# Patient Record
Sex: Female | Born: 1970 | Race: White | Hispanic: No | Marital: Married | Smoking: Former smoker
Health system: Southern US, Community
[De-identification: ages and names within clinical notes are randomized; demographics above are authoritative.]

## PROBLEM LIST (undated history)

## (undated) DIAGNOSIS — F329 Major depressive disorder, single episode, unspecified: Secondary | ICD-10-CM

## (undated) DIAGNOSIS — D369 Benign neoplasm, unspecified site: Secondary | ICD-10-CM

## (undated) DIAGNOSIS — F32A Depression, unspecified: Secondary | ICD-10-CM

## (undated) DIAGNOSIS — R51 Headache: Secondary | ICD-10-CM

## (undated) DIAGNOSIS — F419 Anxiety disorder, unspecified: Secondary | ICD-10-CM

## (undated) DIAGNOSIS — D649 Anemia, unspecified: Secondary | ICD-10-CM

## (undated) DIAGNOSIS — R519 Headache, unspecified: Secondary | ICD-10-CM

## (undated) HISTORY — DX: Major depressive disorder, single episode, unspecified: F32.9

## (undated) HISTORY — DX: Depression, unspecified: F32.A

## (undated) HISTORY — DX: Benign neoplasm, unspecified site: D36.9

---

## 1998-10-04 ENCOUNTER — Other Ambulatory Visit: Admission: RE | Admit: 1998-10-04 | Discharge: 1998-10-04 | Payer: Self-pay | Admitting: Gynecology

## 2000-01-26 ENCOUNTER — Other Ambulatory Visit: Admission: RE | Admit: 2000-01-26 | Discharge: 2000-01-26 | Payer: Self-pay | Admitting: Gynecology

## 2002-07-24 ENCOUNTER — Other Ambulatory Visit: Admission: RE | Admit: 2002-07-24 | Discharge: 2002-07-24 | Payer: Self-pay | Admitting: Gynecology

## 2003-10-19 ENCOUNTER — Other Ambulatory Visit: Admission: RE | Admit: 2003-10-19 | Discharge: 2003-10-19 | Payer: Self-pay | Admitting: Gynecology

## 2005-05-18 ENCOUNTER — Other Ambulatory Visit: Admission: RE | Admit: 2005-05-18 | Discharge: 2005-05-18 | Payer: Self-pay | Admitting: Gynecology

## 2005-07-13 HISTORY — PX: MASS EXCISION: SHX2000

## 2005-07-17 ENCOUNTER — Ambulatory Visit (HOSPITAL_BASED_OUTPATIENT_CLINIC_OR_DEPARTMENT_OTHER): Admission: RE | Admit: 2005-07-17 | Discharge: 2005-07-17 | Payer: Self-pay | Admitting: Gynecology

## 2005-07-17 ENCOUNTER — Encounter (INDEPENDENT_AMBULATORY_CARE_PROVIDER_SITE_OTHER): Payer: Self-pay | Admitting: *Deleted

## 2006-08-02 ENCOUNTER — Other Ambulatory Visit: Admission: RE | Admit: 2006-08-02 | Discharge: 2006-08-02 | Payer: Self-pay | Admitting: Gynecology

## 2008-01-10 ENCOUNTER — Other Ambulatory Visit: Admission: RE | Admit: 2008-01-10 | Discharge: 2008-01-10 | Payer: Self-pay | Admitting: Gynecology

## 2009-05-21 ENCOUNTER — Encounter: Admission: RE | Admit: 2009-05-21 | Discharge: 2009-07-09 | Payer: Self-pay | Admitting: Neurology

## 2009-08-13 ENCOUNTER — Other Ambulatory Visit: Admission: RE | Admit: 2009-08-13 | Discharge: 2009-08-13 | Payer: Self-pay | Admitting: Gynecology

## 2009-08-13 ENCOUNTER — Ambulatory Visit: Payer: Self-pay | Admitting: Gynecology

## 2009-08-19 ENCOUNTER — Ambulatory Visit: Payer: Self-pay | Admitting: Gynecology

## 2010-11-28 NOTE — H&P (Signed)
NAMEHAYLIN, Gail Jackson              ACCOUNT NO.:  [id number]   MEDICAL RECORD NO.:  [id number]          PATIENT TYPE:  AMB   LOCATION:  [facility]                         FACILITY:  [facility]   PHYSICIAN:  Timothy P. Fontaine, M.D.DATE OF BIRTH:  1970/08/09   DATE OF ADMISSION:  DATE OF DISCHARGE:                                HISTORY & PHYSICAL   CHIEF COMPLAINT:  Vulvar mass.   HISTORY OF PRESENT ILLNESS:  A 40 year old G1, P75 female, vasectomy birth  control, who presented for an annual exam, at which time a posterior  fourchette vulvar mass was found.  The patient notes that it historically  has been present for several months.  She thought it was a hemorrhoid.  She  is admitted at this time for an excision of this mass.  It is not causing  her any discomfort.  It is lipomatous in feel and overall is felt to be  benign.   PAST MEDICAL HISTORY:  Anxiety/depression.   PAST SURGICAL HISTORY:  None.   CURRENT MEDICATIONS:  Fluoxetine daily.   ALLERGIES:  SULFA.   REVIEW OF SYSTEMS:  Noncontributory.   FAMILY HISTORY:  Noncontributory.   SOCIAL HISTORY:  Noncontributory.   ADMISSION PHYSICAL EXAMINATION:  VITAL SIGNS:  Afebrile.  Vital signs are  stable.  HEENT:  Normal.  LUNGS:  Clear.  CARDIAC:  Regular rate without murmurs, rubs or gallops.  ABDOMEN:  Benign.  PELVIC:  External with lipomatous-feeling 3 cm mass, posterior fourchette.  Feels submucosal.  Does not appear to communicate rectally.  Mobile.  Nontender.  Vagina otherwise normal.  Cervix grossly normal.  Uterus normal  sized.  __________.  Adnexa without masses or tenderness.  Rectovaginal is  normal.   ASSESSMENT:  Lipomatous-type posterior fourchette vulvar mass, present for  several months for excision.  I reviewed with the patient the expected  intraoperative postoperative course, risks, benefits, indications, and  alternatives.  I reviewed what is involved with the procedure, and the risks  of bleeding,  infection, wound complications, necessitating opening and  draining of incisions, prolonged antibiotics, persistent pain and  dyspareunia following the procedure.  The patient understands and accepts  the risks.  She understands that the specimen will be sent to pathology for  analysis.  Questions were answered, and she is ready to continue with  surgery.      Timothy P. Fontaine, M.D.  Electronically Signed     TPF/MEDQ  D:  07/14/2005  T:  07/14/2005  Job:  [id number]

## 2010-11-28 NOTE — Op Note (Signed)
Gail Jackson, Gail Jackson              ACCOUNT NO.:  [id number]   MEDICAL RECORD NO.:  [id number]          PATIENT TYPE:  AMB   LOCATION:  [facility]                         FACILITY:  [facility]   PHYSICIAN:  Timothy P. Fontaine, M.D.DATE OF BIRTH:  February 23, 1971   DATE OF PROCEDURE:  07/17/2005  DATE OF DISCHARGE:                                 OPERATIVE REPORT   PREOPERATIVE DIAGNOSES:  Vulvar mass.   POSTOPERATIVE DIAGNOSES:  Vulvar mass.   PROCEDURE:  Excision of vulvar mass.   SURGEON:  Timothy P. Fontaine, M.D.   ANESTHETIC:  IV sedation, local 1% lidocaine.   ESTIMATED BLOOD LOSS:  Minimal.   SPECIMEN:  Vulvar mass.   COMPLICATIONS:  None.   FINDINGS:  A 2.5 cm well circumscribed fleshy marble-size mass subcutaneous  at the posterior fourchette. No overlying skin changes;  no palpable groin  adenopathy.  Vulva, otherwise grossly normal, bimanual without masses or  abnormalities.   DESCRIPTION OF PROCEDURE:  The patient was taken to the operating room;  underwent IV sedation; and was placed in the dorsal lithotomy position,  received a perineal superficial vaginal preparation with Betadine solution.  The patient was draped in the usual fashion; and subsequently the skin  surrounding the mass at the posterior fourchette were submucosally injected  using 1% lidocaine. Approximately a 6 mL total was used.   Using an elliptical incision the mass was then excised in its entirety to  include the overlying skin. Electrocautery was used to achieve subcutaneous  hemostasis; and the skin was subsequent reapproximated using 4-0 Vicryl in a  running subcuticular stitch. The area was recleansed with Betadine at the  end of the procedure.  The patient placed in a supine position; and taken to  recovery room in good condition, having tolerated the procedure well.      Timothy P. Fontaine, M.D.  Electronically Signed     TPF/MEDQ  D:  07/17/2005  T:  07/17/2005  Job:  [id number]

## 2011-06-25 ENCOUNTER — Encounter: Payer: Self-pay | Admitting: Anesthesiology

## 2011-07-03 ENCOUNTER — Ambulatory Visit (INDEPENDENT_AMBULATORY_CARE_PROVIDER_SITE_OTHER): Payer: BC Managed Care – PPO | Admitting: Gynecology

## 2011-07-03 ENCOUNTER — Encounter: Payer: Self-pay | Admitting: Gynecology

## 2011-07-03 VITALS — BP 138/82 | Ht 64.0 in | Wt 183.0 lb

## 2011-07-03 DIAGNOSIS — Z13220 Encounter for screening for lipoid disorders: Secondary | ICD-10-CM

## 2011-07-03 DIAGNOSIS — Z131 Encounter for screening for diabetes mellitus: Secondary | ICD-10-CM

## 2011-07-03 DIAGNOSIS — Z01419 Encounter for gynecological examination (general) (routine) without abnormal findings: Secondary | ICD-10-CM

## 2011-07-03 DIAGNOSIS — N949 Unspecified condition associated with female genital organs and menstrual cycle: Secondary | ICD-10-CM

## 2011-07-03 DIAGNOSIS — R102 Pelvic and perineal pain: Secondary | ICD-10-CM

## 2011-07-03 NOTE — Patient Instructions (Signed)
Recheck blood pressure as discussed. Remains elevated follow up with her primary physician. Follow up for ultrasound.

## 2011-07-03 NOTE — Progress Notes (Signed)
Addended byCammie Mcgee T on: 07/03/2011 05:55 PM   Modules accepted: Orders

## 2011-07-03 NOTE — Progress Notes (Signed)
Addended byCammie Mcgee T on: 07/03/2011 04:19 PM   Modules accepted: Orders

## 2011-07-03 NOTE — Progress Notes (Signed)
Gail Jackson 1970/08/16 [id number]        40 y.o.  for annual exam.  Notes the right lower quadrant pain that she's been having seems to continue. It occurs every several days last minutes to 20 minutes cramping in nature. She had an ultrasound done January 2011 which was negative with the exception of a small probable physiologic cyst on the left ovary.  Past medical history,surgical history, medications, allergies, family history and social history were all reviewed and documented in the EPIC chart. ROS:  Was performed and pertinent positives and negatives are included in the history.  Exam: chaperone present Filed Vitals:   07/03/11 0907  BP: 138/82   General appearance  Normal Skin grossly normal Head/Neck normal with no cervical or supraclavicular adenopathy thyroid normal Lungs  clear Cardiac RR, without RMG Abdominal  soft, nontender, without masses, organomegaly or hernia Breasts  examined lying and sitting without masses, retractions, discharge or axillary adenopathy. Pelvic  Ext/BUS/vagina  normal   Cervix  normal    Uterus  anteverted, normal size, shape and contour, midline and mobile nontender   Adnexa  Without masses or tenderness    Anus and perineum  normal   Rectovaginal  normal sphincter tone without palpated masses or tenderness.    Assessment/Plan:  40 y.o. female for annual exam.    1. Right lower quadrant discomfort. Exam is normal ultrasound 2 years ago was normal. Recommend repeat ultrasound now. Options to include laparoscopy rule out adhesions or endometriosis discussed. GI possibility or GU was also reviewed. Although she does not have overt diarrhea constipation or urinary symptoms.Patient will follow for ultrasound and we'll go from there. 2. Breast health. Recommended baseline mammogram now. She does have some breast cancer history in her family I recommended she go ahead and get her baseline now and patient agrees to schedule. SBE monthly  reviewed. 3. Pap smear. She has no history of abnormal Pap smears in the past with normal annual reports in her chart, the last being 2011. No Pap smear was done today. I reviewed current screening guidelines we'll plan on every three-year screening she is comfortable with this. 4. Health maintenance. Mild elevated blood pressure noted to the patient. She has had this in the past and feels it is stress related. She actively sees her primary, in fact recently had blood work done through their office and no blood work was done today.  I have asked her just to follow up and make sure she has her blood pressure rechecked in a non-exam situation. If it does remain elevated she will follow up with her primary.    Dara Lords MD, 9:39 AM 07/03/2011

## 2011-07-20 ENCOUNTER — Ambulatory Visit (INDEPENDENT_AMBULATORY_CARE_PROVIDER_SITE_OTHER): Payer: BC Managed Care – PPO | Admitting: Gynecology

## 2011-07-20 ENCOUNTER — Encounter: Payer: Self-pay | Admitting: Gynecology

## 2011-07-20 ENCOUNTER — Ambulatory Visit (INDEPENDENT_AMBULATORY_CARE_PROVIDER_SITE_OTHER): Payer: BC Managed Care – PPO

## 2011-07-20 DIAGNOSIS — N831 Corpus luteum cyst of ovary, unspecified side: Secondary | ICD-10-CM

## 2011-07-20 DIAGNOSIS — R102 Pelvic and perineal pain: Secondary | ICD-10-CM

## 2011-07-20 DIAGNOSIS — N800 Endometriosis of uterus: Secondary | ICD-10-CM

## 2011-07-20 DIAGNOSIS — N949 Unspecified condition associated with female genital organs and menstrual cycle: Secondary | ICD-10-CM

## 2011-07-20 DIAGNOSIS — N83209 Unspecified ovarian cyst, unspecified side: Secondary | ICD-10-CM

## 2011-07-20 NOTE — Progress Notes (Signed)
Patient presents for ultrasound due to her pelvic/abdominal pain. Ultrasound shows endometrial echo 2.5 mm noting she is in her luteal phase. She has a cyst on the right ovary consistent with a corpus luteum measuring 20 mm. Left ovary is normal. She does have some post void residual. On questioning she has no urinary symptoms.  I reviewed findings of her ultrasound with her. Options for laparoscopy rule out endometriosis or adhesive disease discussed. She does not feel that her pain warrants this and would prefer just to monitor her pain at present. If it does persist or certainly worsen she will call me and we will start with a GI evaluation. Ultimately possible laparoscopy. Patient is comfortable with and agrees with the plan.

## 2011-07-20 NOTE — Patient Instructions (Signed)
Keep pain calender. As long as less frequent and acceptable then we will watch. If pain persists or certainly worsens then call me and we will arrange a GI evaluation.

## 2011-07-24 ENCOUNTER — Other Ambulatory Visit: Payer: BC Managed Care – PPO

## 2011-07-24 ENCOUNTER — Ambulatory Visit: Payer: BC Managed Care – PPO | Admitting: Gynecology

## 2013-04-05 ENCOUNTER — Other Ambulatory Visit: Payer: Self-pay | Admitting: Gynecology

## 2013-04-05 DIAGNOSIS — Z1231 Encounter for screening mammogram for malignant neoplasm of breast: Secondary | ICD-10-CM

## 2013-04-06 ENCOUNTER — Ambulatory Visit (HOSPITAL_COMMUNITY): Payer: BC Managed Care – PPO

## 2013-04-10 ENCOUNTER — Ambulatory Visit (HOSPITAL_COMMUNITY): Payer: BC Managed Care – PPO

## 2013-04-10 ENCOUNTER — Ambulatory Visit (HOSPITAL_BASED_OUTPATIENT_CLINIC_OR_DEPARTMENT_OTHER)
Admission: RE | Admit: 2013-04-10 | Discharge: 2013-04-10 | Disposition: A | Discharge status: Home / Self Care | Payer: Commercial Managed Care - PPO | Source: Ambulatory Visit | Attending: Gynecology | Admitting: Gynecology

## 2013-04-10 DIAGNOSIS — Z1231 Encounter for screening mammogram for malignant neoplasm of breast: Secondary | ICD-10-CM | POA: Insufficient documentation

## 2014-05-14 ENCOUNTER — Encounter: Payer: Self-pay | Admitting: Gynecology

## 2014-06-29 ENCOUNTER — Other Ambulatory Visit: Payer: Self-pay | Admitting: Physical Medicine and Rehabilitation

## 2014-06-29 DIAGNOSIS — M5416 Radiculopathy, lumbar region: Secondary | ICD-10-CM

## 2014-07-02 ENCOUNTER — Ambulatory Visit
Admission: RE | Admit: 2014-07-02 | Discharge: 2014-07-02 | Disposition: A | Discharge status: Home / Self Care | Payer: Commercial Managed Care - PPO | Source: Ambulatory Visit | Attending: Physical Medicine and Rehabilitation | Admitting: Physical Medicine and Rehabilitation

## 2014-07-02 DIAGNOSIS — M5416 Radiculopathy, lumbar region: Secondary | ICD-10-CM

## 2014-07-02 MED ORDER — METHYLPREDNISOLONE ACETATE 40 MG/ML INJ SUSP (RADIOLOG
120.0000 mg | Freq: Once | INTRAMUSCULAR | Status: AC
  Administered 2014-07-02: 120 mg via EPIDURAL

## 2014-07-02 MED ORDER — IOHEXOL 180 MG/ML  SOLN
1.0000 mL | Freq: Once | INTRAMUSCULAR | Status: AC | PRN
  Administered 2014-07-02: 1 mL via EPIDURAL

## 2014-07-02 NOTE — Discharge Instructions (Signed)

## 2014-07-26 ENCOUNTER — Ambulatory Visit: Payer: Self-pay | Admitting: Orthopedic Surgery

## 2014-07-27 ENCOUNTER — Ambulatory Visit: Payer: Self-pay | Admitting: Orthopedic Surgery

## 2014-07-27 NOTE — H&P (Signed)
Gail Jackson is an 44 y.o. female.   Chief Complaint: back and left leg pain HPI: The patient is a 44 year old female who presents today for follow up of their back. The patient is being followed for their low back symptoms. They are now 4 week(s) out from when symptoms began. Symptoms reported today include: pain (better), numbness (left calf) and foot pain ("electrical shocks" in the left ankle). Current treatment includes: activity modification, NSAIDs and pain medications. The following medication has been used for pain control: antiinflammatory medication (ibuprofen as needed) and Tylenol (as needed).  Gail Jackson follows up. Continued pain and weakness. She feels like her pain is better, as she has not been doing any type of activities. She tried to ambulate over the weekend and had some severe pain down into her ankle.  Past Medical History  Diagnosis Date  . Angiofibroma     CELLULAR, VULVAR ANGIOFIBROMA , BENIGN  . Depression     Past Surgical History  Procedure Laterality Date  . Mass excision  07/2005    VULVA    Family History  Problem Relation Age of Onset  . Hypertension Mother   . Cancer Mother     BLADDER  . Hypertension Father   . Cancer Father     COLON  . Heart disease Father   . Breast cancer Paternal Aunt   . Diabetes Paternal Grandmother   . Breast cancer Paternal Aunt   . Breast cancer Paternal Aunt    Social History:  reports that she quit smoking about 12 years ago. She has never used smokeless tobacco. She reports that she drinks alcohol. She reports that she does not use illicit drugs.  Allergies:  Allergies  Allergen Reactions  . Sulfa Antibiotics Rash     (Not in a hospital admission)  No results found for this or any previous visit (from the past 48 hour(s)). No results found.  Review of Systems  Constitutional: Negative.   HENT: Negative.   Eyes: Negative.   Respiratory: Negative.   Cardiovascular: Negative.    Gastrointestinal: Negative.   Genitourinary: Negative.   Musculoskeletal: Positive for back pain.  Skin: Negative.   Neurological: Positive for sensory change and focal weakness.  Psychiatric/Behavioral: Negative.     There were no vitals taken for this visit. Physical Exam  Constitutional: She is oriented to person, place, and time. She appears well-developed and well-nourished.  HENT:  Head: Normocephalic and atraumatic.  Eyes: Conjunctivae and EOM are normal. Pupils are equal, round, and reactive to light.  Neck: Normal range of motion. Neck supple.  Cardiovascular: Normal rate and regular rhythm.   Respiratory: Effort normal and breath sounds normal.  GI: Soft. Bowel sounds are normal.  Musculoskeletal:  On exam, she's upright, in mild distress. Mood and affect appropriate. She walks with an antalgic gait. SLR produces buttock, thigh and calf pain on the left, negative on the right. She has quad weakness at 5-/5 as she does EHL weakness, 4+/5, as well as tibialis anterior weakness. Altered sensation in the L4 dermatome. Pain with extension.  Lumbar spine exam reveals no evidence of soft tissue swelling, ecchymosis or deformity. The abdomen is soft and nontender. Nontender over the trochanters. No cellulitis or lymphadenopathy.  Good range of motion of the lumbar spine without associated pain. Motor is 5/5 including plantar flexion and hamstrings. Patient is normoreflexic. There is no Babinski or clonus. Sensory exam is intact to light touch. The patient has good distal pulses. No  DVT. No pain and normal range of motion without instability of the hips, knees and ankles.  Neurological: She is alert and oriented to person, place, and time. She has normal reflexes.  Skin: Skin is warm and dry.  Psychiatric: She has a normal mood and affect.    MRI demonstrates an extruded fragment, fairly large, at L4-5 extending cephalad, compressing into the 4 foramen, compressing the 4 and  5 roots.  Three view radiographs demonstrate disc degeneration at 4-5 and 5-1. No instability in flexion and extension.  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  We discussed options. We discussed increasing her level of activity and ambulation to determine whether she has persistent radicular symptoms associated with activity. I do feel that with ongoing neurologic deficit in the presence of a neural compressive lesion, it would be wise to consider decompression.  Risks and benefits of this procedure were discussed with the patient including worsening of symptoms, no changes in symptoms, recurrent disc herniation, scar tissue, epidural fibrosis, damage to neurovascular structures, cerebral spinal fluid leak which would require repair or patching, DVT, PE, anesthetic complications, etc. We discussed the perioperative course, the hospitalization, and the need for postoperative rehabilitation and the time estimate for recovery. We also discussed the possibility of future surgery including repeat decompression, fusion. The patient was provided an illustrated handout which was discussed in detail. Appropriate anatomic models were used as well.  Ms. Templeman asked concerning whether she could have the surgery done in [place]. 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 With the risks associated with spinal decompression, I would not recommend that type of treatment for this type of injury, especially in the presence of neurologic deficit. We will proceed with lumbar decompression scheduling. They will increase her level of activity over the weekend at an [employer] show. If that continues to aggravate her symptoms in terms of weakness and pain, we will continue with our plan to  proceed with an outpatient decompression. If she has resolution of her symptoms including her weakness, then I certainly indicated this could then be converted to observation. This is now near 4 weeks in duration, and she's had home stretches and exercises, Prednisone and Norco. We discussed continued HEP, activity modification with her and home stretching and an attempt at a McKenzie type technique. If she improves dramatically, then we can consider conservative observation. In the presence of a neural compressive lesion, with a neurologic deficit, I recommend decompression.  Plan microlumbar decompression L4-5 left, possible L3-4  Skilynn Durney M. PA-C for Dr. Tonita Cong 07/27/2014, 12:58 PM

## 2014-08-07 NOTE — Patient Instructions (Addendum)
Gail Jackson  08/07/2014   Your procedure is scheduled on:   Report to [facility] Main  Entrance and follow signs to               [place] at      [phone].  Call this number if you have problems the morning of surgery [phone]   Remember:  Do not eat food or drink liquids :After Midnight.     Take these medicines the morning of surgery with A SIP OF WATER: percocet if needed                                You may not have any metal on your body including hair pins and              piercings  Do not wear jewelry, make-up, lotions, powders or perfumes, deodorant.             Do not wear nail polish.  Do not shave  48 hours prior to surgery.              Men may shave face and neck.   Do not bring valuables to the hospital. [place].  Contacts, dentures or bridgework may not be worn into surgery.  Leave suitcase in the car. After surgery it may be brought to your room.     Patients discharged the day of surgery will not be allowed to drive home.  Name and phone number of your driver:  Special Instructions: N/A              Please read over the following fact sheets you were given: _____________________________________________________________________             [facility] - Preparing for Surgery Before surgery, you can play an important role.  Because skin is not sterile, your skin needs to be as free of germs as possible.  You can reduce the number of germs on your skin by washing with CHG (chlorahexidine gluconate) soap before surgery.  CHG is an antiseptic cleaner which kills germs and bonds with the skin to continue killing germs even after washing. Please DO NOT use if you have an allergy to CHG or antibacterial soaps.  If your skin becomes reddened/irritated stop using the CHG and inform your nurse when you arrive at Short Stay. Do not shave (including legs and underarms) for at least  48 hours prior to the first CHG shower.  You may shave your face/neck. Please follow these instructions carefully:  1.  Shower with CHG Soap the night before surgery and the  morning of Surgery.  2.  If you choose to wash your hair, wash your hair first as usual with your  normal  shampoo.  3.  After you shampoo, rinse your hair and body thoroughly to remove the  shampoo.                           4.  Use CHG as you would any other liquid soap.  You can apply chg directly  to the skin and wash  Gently with a scrungie or clean washcloth.  5.  Apply the CHG Soap to your body ONLY FROM THE NECK DOWN.   Do not use on face/ open                           Wound or open sores. Avoid contact with eyes, ears mouth and genitals (private parts).                       Wash face,  Genitals (private parts) with your normal soap.             6.  Wash thoroughly, paying special attention to the area where your surgery  will be performed.  7.  Thoroughly rinse your body with warm water from the neck down.  8.  DO NOT shower/wash with your normal soap after using and rinsing off  the CHG Soap.                9.  Pat yourself dry with a clean towel.            10.  Wear clean pajamas.            11.  Place clean sheets on your bed the night of your first shower and do not  sleep with pets. Day of Surgery : Do not apply any lotions/deodorants the morning of surgery.  Please wear clean clothes to the hospital/surgery center.  FAILURE TO FOLLOW THESE INSTRUCTIONS MAY RESULT IN THE CANCELLATION OF YOUR SURGERY PATIENT SIGNATURE_________________________________  NURSE SIGNATURE__________________________________  ________________________________________________________________________   Adam Phenix  An incentive spirometer is a tool that can help keep your lungs clear and active. This tool measures how well you are filling your lungs with each breath. Taking long deep breaths may  help reverse or decrease the chance of developing breathing (pulmonary) problems (especially infection) following:  A long period of time when you are unable to move or be active. BEFORE THE PROCEDURE   If the spirometer includes an indicator to show your best effort, your nurse or respiratory therapist will set it to a desired goal.  If possible, sit up straight or lean slightly forward. Try not to slouch.  Hold the incentive spirometer in an upright position. INSTRUCTIONS FOR USE  1. Sit on the edge of your bed if possible, or sit up as far as you can in bed or on a chair. 2. Hold the incentive spirometer in an upright position. 3. Breathe out normally. 4. Place the mouthpiece in your mouth and seal your lips tightly around it. 5. Breathe in slowly and as deeply as possible, raising the piston or the ball toward the top of the column. 6. Hold your breath for 3-5 seconds or for as long as possible. Allow the piston or ball to fall to the bottom of the column. 7. Remove the mouthpiece from your mouth and breathe out normally. 8. Rest for a few seconds and repeat Steps 1 through 7 at least 10 times every 1-2 hours when you are awake. Take your time and take a few normal breaths between deep breaths. 9. The spirometer may include an indicator to show your best effort. Use the indicator as a goal to work toward during each repetition. 10. After each set of 10 deep breaths, practice coughing to be sure your lungs are clear. If you have an incision (the cut made at the time of surgery),  support your incision when coughing by placing a pillow or rolled up towels firmly against it. Once you are able to get out of bed, walk around indoors and cough well. You may stop using the incentive spirometer when instructed by your caregiver.  RISKS AND COMPLICATIONS  Take your time so you do not get dizzy or light-headed.  If you are in pain, you may need to take or ask for pain medication before doing  incentive spirometry. It is harder to take a deep breath if you are having pain. AFTER USE  Rest and breathe slowly and easily.  It can be helpful to keep track of a log of your progress. Your caregiver can provide you with a simple table to help with this. If you are using the spirometer at home, follow these instructions: [place] IF:   You are having difficultly using the spirometer.  You have trouble using the spirometer as often as instructed.  Your pain medication is not giving enough relief while using the spirometer.  You develop fever of 100.5 F (38.1 C) or higher. SEEK IMMEDIATE MEDICAL CARE IF:   You cough up bloody sputum that had not been present before.  You develop fever of 102 F (38.9 C) or greater.  You develop worsening pain at or near the incision site. MAKE SURE YOU:   Understand these instructions.  Will watch your condition.  Will get help right away if you are not doing well or get worse. Document Released: 11/09/2006 Document Revised: 09/21/2011 Document Reviewed: 01/10/2007 [facility] Patient Information 2014 [place], [place].   ________________________________________________________________________

## 2014-08-09 ENCOUNTER — Encounter (HOSPITAL_COMMUNITY): Payer: Self-pay

## 2014-08-09 ENCOUNTER — Encounter (HOSPITAL_COMMUNITY)
Admission: RE | Admit: 2014-08-09 | Discharge: 2014-08-09 | Disposition: A | Discharge status: Home / Self Care | Payer: Commercial Managed Care - PPO | Source: Ambulatory Visit | Attending: Specialist | Admitting: Specialist

## 2014-08-09 DIAGNOSIS — M5126 Other intervertebral disc displacement, lumbar region: Secondary | ICD-10-CM | POA: Insufficient documentation

## 2014-08-09 DIAGNOSIS — M4806 Spinal stenosis, lumbar region: Secondary | ICD-10-CM | POA: Diagnosis not present

## 2014-08-09 DIAGNOSIS — Z01818 Encounter for other preprocedural examination: Secondary | ICD-10-CM | POA: Diagnosis present

## 2014-08-09 HISTORY — DX: Headache: R51

## 2014-08-09 HISTORY — DX: Anxiety disorder, unspecified: F41.9

## 2014-08-09 HISTORY — DX: Headache, unspecified: R51.9

## 2014-08-09 HISTORY — DX: Anemia, unspecified: D64.9

## 2014-08-09 LAB — CBC
HCT: 38.6 % (ref 36.0–46.0)
Hemoglobin: 12.4 g/dL (ref 12.0–15.0)
MCH: 28.6 pg (ref 26.0–34.0)
MCHC: 32.1 g/dL (ref 30.0–36.0)
MCV: 88.9 fL (ref 78.0–100.0)
PLATELETS: 309 10*3/uL (ref 150–400)
RBC: 4.34 MIL/uL (ref 3.87–5.11)
RDW: 13 % (ref 11.5–15.5)
WBC: 6.9 10*3/uL (ref 4.0–10.5)

## 2014-08-09 LAB — BASIC METABOLIC PANEL
Anion gap: 9 (ref 5–15)
BUN: 11 mg/dL (ref 6–23)
CHLORIDE: 101 mmol/L (ref 96–112)
CO2: 28 mmol/L (ref 19–32)
CREATININE: 0.65 mg/dL (ref 0.50–1.10)
Calcium: 9.3 mg/dL (ref 8.4–10.5)
Glucose, Bld: 104 mg/dL — ABNORMAL HIGH (ref 70–99)
Potassium: 3.9 mmol/L (ref 3.5–5.1)
Sodium: 138 mmol/L (ref 135–145)

## 2014-08-09 LAB — SURGICAL PCR SCREEN
MRSA, PCR: NEGATIVE
Staphylococcus aureus: NEGATIVE

## 2014-08-09 LAB — HCG, SERUM, QUALITATIVE: Preg, Serum: NEGATIVE

## 2014-08-15 ENCOUNTER — Ambulatory Visit (HOSPITAL_COMMUNITY): Payer: Commercial Managed Care - PPO

## 2014-08-15 ENCOUNTER — Encounter (HOSPITAL_COMMUNITY)
Admission: RE | Disposition: A | Discharge status: Home / Self Care | Payer: Self-pay | Source: Ambulatory Visit | Attending: Specialist

## 2014-08-15 ENCOUNTER — Ambulatory Visit (HOSPITAL_COMMUNITY): Payer: Commercial Managed Care - PPO | Admitting: Certified Registered Nurse Anesthetist

## 2014-08-15 ENCOUNTER — Ambulatory Visit (HOSPITAL_COMMUNITY)
Admission: RE | Admit: 2014-08-15 | Discharge: 2014-08-16 | Disposition: A | Discharge status: Home / Self Care | Payer: Commercial Managed Care - PPO | Source: Ambulatory Visit | Attending: Specialist | Admitting: Specialist

## 2014-08-15 ENCOUNTER — Encounter (HOSPITAL_COMMUNITY): Payer: Self-pay | Admitting: *Deleted

## 2014-08-15 DIAGNOSIS — D649 Anemia, unspecified: Secondary | ICD-10-CM | POA: Insufficient documentation

## 2014-08-15 DIAGNOSIS — M5126 Other intervertebral disc displacement, lumbar region: Secondary | ICD-10-CM

## 2014-08-15 DIAGNOSIS — Z87891 Personal history of nicotine dependence: Secondary | ICD-10-CM | POA: Diagnosis not present

## 2014-08-15 DIAGNOSIS — R52 Pain, unspecified: Secondary | ICD-10-CM

## 2014-08-15 DIAGNOSIS — F329 Major depressive disorder, single episode, unspecified: Secondary | ICD-10-CM | POA: Diagnosis not present

## 2014-08-15 DIAGNOSIS — Z882 Allergy status to sulfonamides status: Secondary | ICD-10-CM | POA: Insufficient documentation

## 2014-08-15 DIAGNOSIS — F419 Anxiety disorder, unspecified: Secondary | ICD-10-CM | POA: Insufficient documentation

## 2014-08-15 DIAGNOSIS — M4806 Spinal stenosis, lumbar region: Secondary | ICD-10-CM | POA: Insufficient documentation

## 2014-08-15 DIAGNOSIS — M48061 Spinal stenosis, lumbar region without neurogenic claudication: Secondary | ICD-10-CM | POA: Diagnosis present

## 2014-08-15 HISTORY — PX: LUMBAR LAMINECTOMY/DECOMPRESSION MICRODISCECTOMY: SHX5026

## 2014-08-15 SURGERY — LUMBAR LAMINECTOMY/DECOMPRESSION MICRODISCECTOMY 1 LEVEL
Anesthesia: General | Site: Back | Laterality: Left

## 2014-08-15 MED ORDER — MENTHOL 3 MG MT LOZG: 1.0000 | LOZENGE | OROMUCOSAL | Status: DC | PRN

## 2014-08-15 MED ORDER — METHOCARBAMOL 500 MG PO TABS: 500.0000 mg | ORAL_TABLET | Freq: Four times a day (QID) | ORAL | Status: DC | PRN

## 2014-08-15 MED ORDER — BUPIVACAINE-EPINEPHRINE (PF) 0.5% -1:200000 IJ SOLN
INTRAMUSCULAR | Status: AC
  Filled 2014-08-15: qty 30

## 2014-08-15 MED ORDER — HYDROMORPHONE HCL 1 MG/ML IJ SOLN
0.2500 mg | INTRAMUSCULAR | Status: DC | PRN
  Administered 2014-08-15 (×2): 0.5 mg via INTRAVENOUS

## 2014-08-15 MED ORDER — POLYMYXIN B SULFATE 500000 UNITS IJ SOLR
INTRAMUSCULAR | Status: DC | PRN
  Administered 2014-08-15: 500 mL

## 2014-08-15 MED ORDER — SCOPOLAMINE 1 MG/3DAYS TD PT72
MEDICATED_PATCH | TRANSDERMAL | Status: AC
  Filled 2014-08-15: qty 1

## 2014-08-15 MED ORDER — BUPIVACAINE-EPINEPHRINE 0.5% -1:200000 IJ SOLN
INTRAMUSCULAR | Status: DC | PRN
  Administered 2014-08-15: 11 mL

## 2014-08-15 MED ORDER — SODIUM CHLORIDE 0.9 % IJ SOLN
3.0000 mL | INTRAMUSCULAR | Status: DC | PRN
  Administered 2014-08-16: 3 mL via INTRAVENOUS
  Filled 2014-08-15: qty 3

## 2014-08-15 MED ORDER — DEXAMETHASONE SODIUM PHOSPHATE 10 MG/ML IJ SOLN
INTRAMUSCULAR | Status: DC | PRN
  Administered 2014-08-15: 10 mg via INTRAVENOUS

## 2014-08-15 MED ORDER — HYDROMORPHONE HCL 1 MG/ML IJ SOLN
INTRAMUSCULAR | Status: AC
  Filled 2014-08-15: qty 1

## 2014-08-15 MED ORDER — PROMETHAZINE HCL 25 MG/ML IJ SOLN
6.2500 mg | INTRAMUSCULAR | Status: AC | PRN
  Administered 2014-08-15 (×2): 6.25 mg via INTRAVENOUS

## 2014-08-15 MED ORDER — RISAQUAD PO CAPS
1.0000 | ORAL_CAPSULE | Freq: Every day | ORAL | Status: DC
Start: 2014-08-15 — End: 2014-08-16
  Administered 2014-08-15 – 2014-08-16 (×2): 1 via ORAL
  Filled 2014-08-15 (×2): qty 1

## 2014-08-15 MED ORDER — METHOCARBAMOL 500 MG PO TABS: 500.0000 mg | ORAL_TABLET | Freq: Three times a day (TID) | ORAL | Status: DC

## 2014-08-15 MED ORDER — NEOSTIGMINE METHYLSULFATE 10 MG/10ML IV SOLN
INTRAVENOUS | Status: DC | PRN
  Administered 2014-08-15: 3 mg via INTRAVENOUS

## 2014-08-15 MED ORDER — ONDANSETRON HCL 4 MG/2ML IJ SOLN: 4.0000 mg | INTRAMUSCULAR | Status: DC | PRN

## 2014-08-15 MED ORDER — OXYCODONE-ACETAMINOPHEN 5-325 MG PO TABS
1.0000 | ORAL_TABLET | ORAL | Status: DC | PRN
  Administered 2014-08-15 – 2014-08-16 (×2): 2 via ORAL
  Administered 2014-08-16 (×2): 1 via ORAL
  Administered 2014-08-16: 2 via ORAL
  Filled 2014-08-15 (×4): qty 2
  Filled 2014-08-15: qty 1

## 2014-08-15 MED ORDER — PROMETHAZINE HCL 25 MG/ML IJ SOLN: 12.5000 mg | Freq: Four times a day (QID) | INTRAMUSCULAR | Status: AC | PRN

## 2014-08-15 MED ORDER — LACTATED RINGERS IV SOLN
INTRAVENOUS | Status: DC
  Administered 2014-08-15 (×2): via INTRAVENOUS

## 2014-08-15 MED ORDER — CEFAZOLIN SODIUM-DEXTROSE 2-3 GM-% IV SOLR
INTRAVENOUS | Status: AC
Start: 2014-08-15 — End: 2014-08-15
  Filled 2014-08-15: qty 50

## 2014-08-15 MED ORDER — PHENOL 1.4 % MT LIQD: 1.0000 | OROMUCOSAL | Status: DC | PRN

## 2014-08-15 MED ORDER — GLYCOPYRROLATE 0.2 MG/ML IJ SOLN
INTRAMUSCULAR | Status: AC
  Filled 2014-08-15: qty 3

## 2014-08-15 MED ORDER — HYDROMORPHONE HCL 1 MG/ML IJ SOLN: 0.5000 mg | INTRAMUSCULAR | Status: DC | PRN

## 2014-08-15 MED ORDER — ROCURONIUM BROMIDE 100 MG/10ML IV SOLN
INTRAVENOUS | Status: DC | PRN
  Administered 2014-08-15: 30 mg via INTRAVENOUS

## 2014-08-15 MED ORDER — BISACODYL 5 MG PO TBEC: 5.0000 mg | DELAYED_RELEASE_TABLET | Freq: Every day | ORAL | Status: DC | PRN

## 2014-08-15 MED ORDER — SODIUM CHLORIDE 0.9 % IJ SOLN
3.0000 mL | Freq: Two times a day (BID) | INTRAMUSCULAR | Status: DC
  Administered 2014-08-16: 3 mL via INTRAVENOUS

## 2014-08-15 MED ORDER — ONDANSETRON HCL 4 MG/2ML IJ SOLN
INTRAMUSCULAR | Status: DC | PRN
  Administered 2014-08-15: 4 mg via INTRAVENOUS

## 2014-08-15 MED ORDER — ACETAMINOPHEN 325 MG PO TABS: 650.0000 mg | ORAL_TABLET | ORAL | Status: DC | PRN

## 2014-08-15 MED ORDER — HYDROCODONE-ACETAMINOPHEN 5-325 MG PO TABS: 1.0000 | ORAL_TABLET | ORAL | Status: DC | PRN

## 2014-08-15 MED ORDER — SENNOSIDES-DOCUSATE SODIUM 8.6-50 MG PO TABS: 1.0000 | ORAL_TABLET | Freq: Every evening | ORAL | Status: DC | PRN

## 2014-08-15 MED ORDER — ONDANSETRON HCL 4 MG/2ML IJ SOLN
INTRAMUSCULAR | Status: AC
  Filled 2014-08-15: qty 2

## 2014-08-15 MED ORDER — METHOCARBAMOL 1000 MG/10ML IJ SOLN
500.0000 mg | Freq: Four times a day (QID) | INTRAVENOUS | Status: DC | PRN
  Administered 2014-08-15: 500 mg via INTRAVENOUS
  Filled 2014-08-15 (×2): qty 5

## 2014-08-15 MED ORDER — KCL IN DEXTROSE-NACL 20-5-0.45 MEQ/L-%-% IV SOLN
INTRAVENOUS | Status: AC
  Administered 2014-08-15: 13:00:00 via INTRAVENOUS
  Filled 2014-08-15 (×2): qty 1000

## 2014-08-15 MED ORDER — CEFAZOLIN SODIUM-DEXTROSE 2-3 GM-% IV SOLR
2.0000 g | Freq: Three times a day (TID) | INTRAVENOUS | Status: AC
  Administered 2014-08-15 – 2014-08-16 (×3): 2 g via INTRAVENOUS
  Filled 2014-08-15 (×3): qty 50

## 2014-08-15 MED ORDER — CEFAZOLIN SODIUM-DEXTROSE 2-3 GM-% IV SOLR
2.0000 g | INTRAVENOUS | Status: AC
  Administered 2014-08-15: 2 g via INTRAVENOUS

## 2014-08-15 MED ORDER — FENTANYL CITRATE 0.05 MG/ML IJ SOLN
INTRAMUSCULAR | Status: AC
  Filled 2014-08-15: qty 5

## 2014-08-15 MED ORDER — DOCUSATE SODIUM 100 MG PO CAPS
100.0000 mg | ORAL_CAPSULE | Freq: Two times a day (BID) | ORAL | Status: DC
  Administered 2014-08-16: 100 mg via ORAL

## 2014-08-15 MED ORDER — DULOXETINE HCL 60 MG PO CPEP
60.0000 mg | ORAL_CAPSULE | Freq: Every evening | ORAL | Status: DC
  Administered 2014-08-15: 60 mg via ORAL
  Filled 2014-08-15 (×2): qty 1

## 2014-08-15 MED ORDER — THROMBIN 5000 UNITS EX SOLR
CUTANEOUS | Status: DC | PRN
  Administered 2014-08-15: 10000 [IU] via TOPICAL

## 2014-08-15 MED ORDER — FLEET ENEMA 7-19 GM/118ML RE ENEM: 1.0000 | ENEMA | Freq: Once | RECTAL | Status: AC | PRN

## 2014-08-15 MED ORDER — PROPOFOL 10 MG/ML IV BOLUS
INTRAVENOUS | Status: AC
  Filled 2014-08-15: qty 20

## 2014-08-15 MED ORDER — THROMBIN 5000 UNITS EX SOLR
CUTANEOUS | Status: AC
  Filled 2014-08-15: qty 10000

## 2014-08-15 MED ORDER — NEOSTIGMINE METHYLSULFATE 10 MG/10ML IV SOLN
INTRAVENOUS | Status: AC
  Filled 2014-08-15: qty 1

## 2014-08-15 MED ORDER — ADULT MULTIVITAMIN W/MINERALS CH
1.0000 | ORAL_TABLET | Freq: Every day | ORAL | Status: DC
  Administered 2014-08-15 – 2014-08-16 (×2): 1 via ORAL
  Filled 2014-08-15 (×2): qty 1

## 2014-08-15 MED ORDER — LIDOCAINE HCL (CARDIAC) 20 MG/ML IV SOLN
INTRAVENOUS | Status: DC | PRN
  Administered 2014-08-15: 100 mg via INTRAVENOUS

## 2014-08-15 MED ORDER — DOCUSATE SODIUM 100 MG PO CAPS: 100.0000 mg | ORAL_CAPSULE | Freq: Two times a day (BID) | ORAL | Status: DC | PRN

## 2014-08-15 MED ORDER — FENTANYL CITRATE 0.05 MG/ML IJ SOLN
INTRAMUSCULAR | Status: DC | PRN
  Administered 2014-08-15 (×5): 50 ug via INTRAVENOUS

## 2014-08-15 MED ORDER — ALUM & MAG HYDROXIDE-SIMETH 200-200-20 MG/5ML PO SUSP: 30.0000 mL | Freq: Four times a day (QID) | ORAL | Status: DC | PRN

## 2014-08-15 MED ORDER — SCOPOLAMINE 1 MG/3DAYS TD PT72
1.0000 | MEDICATED_PATCH | TRANSDERMAL | Status: DC
  Administered 2014-08-15: 1 via TRANSDERMAL
  Filled 2014-08-15: qty 1

## 2014-08-15 MED ORDER — DEXAMETHASONE SODIUM PHOSPHATE 10 MG/ML IJ SOLN
INTRAMUSCULAR | Status: AC
  Filled 2014-08-15: qty 1

## 2014-08-15 MED ORDER — SODIUM CHLORIDE 0.9 % IR SOLN
Status: AC
  Filled 2014-08-15: qty 1

## 2014-08-15 MED ORDER — ACETAMINOPHEN 650 MG RE SUPP: 650.0000 mg | RECTAL | Status: DC | PRN

## 2014-08-15 MED ORDER — MIDAZOLAM HCL 5 MG/5ML IJ SOLN
INTRAMUSCULAR | Status: DC | PRN
  Administered 2014-08-15: 2 mg via INTRAVENOUS

## 2014-08-15 MED ORDER — SODIUM CHLORIDE 0.9 % IV SOLN: 250.0000 mL | INTRAVENOUS | Status: DC

## 2014-08-15 MED ORDER — PROMETHAZINE HCL 25 MG/ML IJ SOLN
INTRAMUSCULAR | Status: AC
  Filled 2014-08-15: qty 1

## 2014-08-15 MED ORDER — OXYCODONE-ACETAMINOPHEN 5-325 MG PO TABS: 1.0000 | ORAL_TABLET | ORAL | Status: DC | PRN

## 2014-08-15 MED ORDER — GLYCOPYRROLATE 0.2 MG/ML IJ SOLN
INTRAMUSCULAR | Status: DC | PRN
  Administered 2014-08-15: 0.4 mg via INTRAVENOUS

## 2014-08-15 MED ORDER — MIDAZOLAM HCL 2 MG/2ML IJ SOLN
INTRAMUSCULAR | Status: AC
  Filled 2014-08-15: qty 2

## 2014-08-15 MED ORDER — LIDOCAINE HCL (CARDIAC) 20 MG/ML IV SOLN
INTRAVENOUS | Status: AC
  Filled 2014-08-15: qty 5

## 2014-08-15 MED ORDER — SUCCINYLCHOLINE CHLORIDE 20 MG/ML IJ SOLN
INTRAMUSCULAR | Status: DC | PRN
  Administered 2014-08-15: 100 mg via INTRAVENOUS

## 2014-08-15 MED ORDER — PROPOFOL 10 MG/ML IV BOLUS
INTRAVENOUS | Status: DC | PRN
  Administered 2014-08-15: 150 mg via INTRAVENOUS

## 2014-08-15 MED ORDER — ROCURONIUM BROMIDE 100 MG/10ML IV SOLN
INTRAVENOUS | Status: AC
  Filled 2014-08-15: qty 1

## 2014-08-15 SURGICAL SUPPLY — 44 items
BAG ZIPLOCK 12X15 (MISCELLANEOUS) ×3 IMPLANT
CLEANER TIP ELECTROSURG 2X2 (MISCELLANEOUS) ×3 IMPLANT
CLOSURE WOUND 1/2 X4 (GAUZE/BANDAGES/DRESSINGS) ×1
CLOTH 2% CHLOROHEXIDINE 3PK (PERSONAL CARE ITEMS) ×3 IMPLANT
DRAPE MICROSCOPE LEICA (MISCELLANEOUS) ×3 IMPLANT
DRAPE POUCH INSTRU U-SHP 10X18 (DRAPES) ×3 IMPLANT
DRAPE SURG 17X11 SM STRL (DRAPES) ×3 IMPLANT
DRAPE UTILITY XL STRL (DRAPES) ×3 IMPLANT
DRSG AQUACEL AG ADV 3.5X 4 (GAUZE/BANDAGES/DRESSINGS) ×3 IMPLANT
DRSG AQUACEL AG ADV 3.5X 6 (GAUZE/BANDAGES/DRESSINGS) IMPLANT
DURAPREP 26ML APPLICATOR (WOUND CARE) ×3 IMPLANT
DURASEAL SPINE SEALANT 3ML (MISCELLANEOUS) IMPLANT
ELECT BLADE TIP CTD 4 INCH (ELECTRODE) ×3 IMPLANT
ELECT REM PT RETURN 9FT ADLT (ELECTROSURGICAL) ×3
ELECTRODE REM PT RTRN 9FT ADLT (ELECTROSURGICAL) ×1 IMPLANT
GLOVE BIOGEL PI IND STRL 7.5 (GLOVE) ×1 IMPLANT
GLOVE BIOGEL PI INDICATOR 7.5 (GLOVE) ×2
GLOVE SURG SS PI 7.5 STRL IVOR (GLOVE) ×3 IMPLANT
GLOVE SURG SS PI 8.0 STRL IVOR (GLOVE) ×6 IMPLANT
GOWN STRL REUS W/TWL XL LVL3 (GOWN DISPOSABLE) ×6 IMPLANT
IV CATH 14GX2 1/4 (CATHETERS) ×3 IMPLANT
KIT BASIN OR (CUSTOM PROCEDURE TRAY) ×3 IMPLANT
KIT POSITIONING SURG ANDREWS (MISCELLANEOUS) ×3 IMPLANT
MANIFOLD NEPTUNE II (INSTRUMENTS) ×3 IMPLANT
NEEDLE SPNL 18GX3.5 QUINCKE PK (NEEDLE) ×6 IMPLANT
PACK LAMINECTOMY ORTHO (CUSTOM PROCEDURE TRAY) ×3 IMPLANT
PATTIES SURGICAL .5 X.5 (GAUZE/BANDAGES/DRESSINGS) IMPLANT
PATTIES SURGICAL .75X.75 (GAUZE/BANDAGES/DRESSINGS) ×3 IMPLANT
PATTIES SURGICAL 1X1 (DISPOSABLE) IMPLANT
SPONGE SURGIFOAM ABS GEL 100 (HEMOSTASIS) ×3 IMPLANT
STAPLER VISISTAT (STAPLE) IMPLANT
STRIP CLOSURE SKIN 1/2X4 (GAUZE/BANDAGES/DRESSINGS) ×2 IMPLANT
SUT NURALON 4 0 TR CR/8 (SUTURE) IMPLANT
SUT PROLENE 3 0 PS 2 (SUTURE) ×3 IMPLANT
SUT VIC AB 1 CT1 27 (SUTURE) ×4
SUT VIC AB 1 CT1 27XBRD ANTBC (SUTURE) ×2 IMPLANT
SUT VIC AB 1-0 CT2 27 (SUTURE) IMPLANT
SUT VIC AB 2-0 CT1 27 (SUTURE)
SUT VIC AB 2-0 CT1 TAPERPNT 27 (SUTURE) IMPLANT
SUT VIC AB 2-0 CT2 27 (SUTURE) ×3 IMPLANT
SYR 3ML LL SCALE MARK (SYRINGE) ×3 IMPLANT
TOWEL OR 17X26 10 PK STRL BLUE (TOWEL DISPOSABLE) ×3 IMPLANT
TOWEL OR NON WOVEN STRL DISP B (DISPOSABLE) ×3 IMPLANT
YANKAUER SUCT BULB TIP NO VENT (SUCTIONS) IMPLANT

## 2014-08-15 NOTE — Anesthesia Preprocedure Evaluation (Addendum)
Anesthesia Evaluation  Patient identified by MRN, date of birth, ID band Patient awake    Reviewed: Allergy & Precautions, NPO status , Patient's Chart, lab work & pertinent test results  Airway Mallampati: II  TM Distance: >3 FB Neck ROM: Full    Dental no notable dental hx. (+)    Pulmonary former smoker,  breath sounds clear to auscultation  Pulmonary exam normal       Cardiovascular Exercise Tolerance: Good negative cardio ROS  Rhythm:Regular Rate:Normal     Neuro/Psych  Headaches, PSYCHIATRIC DISORDERS Anxiety Depression    GI/Hepatic negative GI ROS, Neg liver ROS,   Endo/Other  negative endocrine ROS  Renal/GU negative Renal ROS  negative genitourinary   Musculoskeletal negative musculoskeletal ROS (+)   Abdominal (+) + obese,   Peds negative pediatric ROS (+)  Hematology  (+) anemia ,   Anesthesia Other Findings   Reproductive/Obstetrics negative OB ROS                           Anesthesia Physical Anesthesia Plan  ASA: II  Anesthesia Plan: General   Post-op Pain Management:    Induction: Intravenous  Airway Management Planned: Oral ETT  Additional Equipment:   Intra-op Plan:   Post-operative Plan: Extubation in OR  Informed Consent: I have reviewed the patients History and Physical, chart, labs and discussed the procedure including the risks, benefits and alternatives for the proposed anesthesia with the patient or authorized representative who has indicated his/her understanding and acceptance.   Dental advisory given  Plan Discussed with: CRNA  Anesthesia Plan Comments:         Anesthesia Quick Evaluation

## 2014-08-15 NOTE — Anesthesia Postprocedure Evaluation (Signed)
  Anesthesia Post-op Note  Patient: Gail Jackson  Procedure(s) Performed: Procedure(s) (LRB): MICRO LUMBAR DECOMPRESSION L4 - L5 LEFT  (Left)  Patient Location: PACU  Anesthesia Type: General  Level of Consciousness: awake and alert   Airway and Oxygen Therapy: Patient Spontanous Breathing  Post-op Pain: mild  Post-op Assessment: Post-op Vital signs reviewed, Patient's Cardiovascular Status Stable, Respiratory Function Stable, Patent Airway and No signs of Nausea or vomiting  Last Vitals:  Filed Vitals:   08/15/14 1301  BP: 109/66  Pulse: 98  Temp: 36.7 C  Resp: 16    Post-op Vital Signs: stable   Complications: No apparent anesthesia complications

## 2014-08-15 NOTE — Anesthesia Procedure Notes (Signed)
Procedure Name: Intubation Date/Time: 08/15/2014 8:37 AM Performed by: Maxwell Caul Pre-anesthesia Checklist: Patient identified, Emergency Drugs available, Suction available and Patient being monitored Patient Re-evaluated:Patient Re-evaluated prior to inductionOxygen Delivery Method: Circle system utilized Preoxygenation: Pre-oxygenation with 100% oxygen Intubation Type: IV induction Ventilation: Mask ventilation without difficulty Laryngoscope Size: Mac and 4 Grade View: Grade I Tube type: Oral Tube size: 7.0 mm Number of attempts: 1 Secured at: 21 cm Tube secured with: Tape Dental Injury: Teeth and Oropharynx as per pre-operative assessment

## 2014-08-15 NOTE — Interval H&P Note (Signed)
History and Physical Interval Note:  08/15/2014 8:20 AM  Gail Jackson  has presented today for surgery, with the diagnosis of HNP L4 - L5 Left  The various methods of treatment have been discussed with the patient and family. After consideration of risks, benefits and other options for treatment, the patient has consented to  Procedure(s): MICRO LUMBAR DECOMPRESSION L4 - L5 LEFT WITH POSSIBLE L3 - L4  MICRODISCECTOMY 1 LEVEL (Left) as a surgical intervention .  The patient's history has been reviewed, patient examined, no change in status, stable for surgery.  I have reviewed the patient's chart and labs.  Questions were answered to the patient's satisfaction.     Tilia Faso C

## 2014-08-15 NOTE — Brief Op Note (Signed)
08/15/2014  10:26 AM  PATIENT:  Gail Jackson  44 y.o. female  PRE-OPERATIVE DIAGNOSIS:  HNP L4 - L5 Left  POST-OPERATIVE DIAGNOSIS:  HNP L4 - L5 Left  PROCEDURE:  Procedure(s): MICRO LUMBAR DECOMPRESSION L4 - L5 LEFT  (Left)  SURGEON:  Surgeon(s) and Role:    * Johnn Hai, MD - Primary  PHYSICIAN ASSISTANT:   ASSISTANTS: Bissell   ANESTHESIA:   general  EBL:  Total I/O In: 1000 [I.V.:1000] Out: 350 [Urine:300; Blood:50]  BLOOD ADMINISTERED:none  DRAINS: none   LOCAL MEDICATIONS USED:  MARCAINE     SPECIMEN:  No Specimen L45 disc  DISPOSITION OF SPECIMEN:  PATHOLOGY  COUNTS:  YES  TOURNIQUET:  * No tourniquets in log *  DICTATION: .Other Dictation: Dictation Number [id number]  PLAN OF CARE: Admit for overnight observation  PATIENT DISPOSITION:  PACU - hemodynamically stable.   Delay start of Pharmacological VTE agent (>24hrs) due to surgical blood loss or risk of bleeding: yes

## 2014-08-15 NOTE — Discharge Instructions (Signed)

## 2014-08-15 NOTE — Plan of Care (Signed)
Problem: Consults Goal: Diagnosis - Spinal Surgery Decompression lumbar laminectomy lumbar 4-5

## 2014-08-15 NOTE — Op Note (Signed)
NAMENAMINE, BEAHM NO.:  [id number]  MEDICAL RECORD NO.:  [id number]  LOCATION:  0092                         FACILITY:  [facility]  PHYSICIAN:  Susa Day, M.D.    DATE OF BIRTH:  April 15, 1971  DATE OF PROCEDURE:  08/15/2014 DATE OF DISCHARGE:                              OPERATIVE REPORT   PREOPERATIVE DIAGNOSIS:  Spinal stenosis, herniated nucleus pulposus extruded, L4-L5, left.  POSTOPERATIVE DIAGNOSIS:  Spinal stenosis, herniated nucleus pulposus extruded, L4-L5, left.  PROCEDURES PERFORMED: 1. Microlumbar decompression, L4-L5, left. 2. Foraminotomies, L4 and L5. 3. Microdiskectomy, L4-L5, left. 4. Retrieval of extruded fragment, L4-L5.  ANESTHESIA:  General.  ASSISTANT:  Cleophas Dunker, PA.  HISTORY:  This is a 44 year old with left lower extremity radicular pain, L4-L5 nerve root distribution secondary to disk herniation, L4-L5, lateral recess stenosis, and extruded fragment at L4-L5.  She was indicated for decompression at L4-L5 and retrieval of extruded fragment. Risks and benefits were discussed including bleeding, infection, damage to neurovascular structure, DVT, PE, anesthetic complications, etc.  TECHNIQUE:  With the patient in supine position, after induction of adequate general anesthesia, 2 g Kefzol, placed prone on the Purdy frame.  All bony prominences were well padded.  Lumbar region was prepped and draped in usual sterile fashion.  Two 18-gauge spinal needle was utilized to localize L4-L5 interspace, confirmed with x-ray. Incision was made from the spinous process, L4-L5.  Subcutaneous tissue was dissected.  Electrocautery was utilized to achieve hemostasis.  A 0.25% epinephrine was infiltrated in the perimuscular tissue. Dorsolumbar fascia identified and divided in line with skin incision. Paraspinous muscle elevated from the lamina of L4-L5.  McCullough retractor was placed.  Operating microscope was draped and brought  in the surgical field after confirmatory radiograph obtained. Hemilaminotomy of the caudad edge of L4 was performed with 2 and 3 mm Kerrison preserving the pars as the fragment had migrated cephalad.  I detached the ligamentum flavum.  I placed a Neuro Patty placement beneath the ligamentum flavum, removed the ligamentum flavum from the interspace.  There was an extensive epidural venous plexus.  Bipolar electrocautery was utilized to achieve hemostasis.  I then identified the L5 root, gently mobilized it medially; performed a foraminotomy of L5 as there was stenosis in the lateral recess secondary to disk herniation, facet hypertrophy, and ligamentum flavum hypertrophy.  After performing a foraminotomy and decompressing the lateral recess to the medial border of the pedicle, I used bipolar electrocautery, utilized to achieve hemostasis.  The focal HNP was noted at the disk space at L4-L5 confirmed.  I performed an annulotomy.  Copious portion of disk material was removed from the disk space with straight upbiting pituitary, further mobilized with an Epstein.  Antibiotic irrigation within the disk space and retrieval of multiple free fragments.  This was a degenerated disk.  We then examined the free fragment that had migrated cephalad to out into the foramen of L4.  This was carried down.  This was scarred and adhesed.  We gently mobilized the free fragment, protected from the L4 nerve root, then removed it with 2 large separate fragments.  Hypertrophic venous plexus was noted, and electrocautery was utilized to achieve hemostasis.  Foraminotomy of L4 was performed. Following this, a neuro probe passed freely up the foramen of L4 and L5. There was no residual disk herniation in the thecal sac, beneath the thecal sac, the foramen of L4 and L5, the shoulder or the axilla of the roots.  We copiously irrigated with antibiotic irrigation.  We placed bone wax on the cancellous surfaces.   Inspection revealed no CSF leakage or active bleeding.  There was 1 cm of excursion of the L5 root, medial to the pedicle.  Thrombin-soaked Gelfoam was placed in laminotomy defect.  After confirmatory radiograph obtained, McCullough retractor was removed.  Paraspinous muscles were irrigated and inspected.  No active bleeding.  We closed the fascia with #1 Vicryl, subcu with 2-0, and the skin with Prolene.  A sterile dressing applied, placed supine on hospital bed, extubated without difficulty, transported to the recovery room in satisfactory condition.  The patient tolerated the procedure well.  There were no complications. Assistant, Cleophas Dunker, PA was used for patient positioning, gentle intermittent neural traction, suction and closure throughout the case.  Minimal blood loss.     Susa Day, M.D.     Geralynn Rile  D:  08/15/2014  T:  08/15/2014  Job:  [id number]

## 2014-08-15 NOTE — H&P (View-Only) (Signed)
Gail Jackson is an 44 y.o. female.   Chief Complaint: back and left leg pain HPI: The patient is a 44 year old female who presents today for follow up of their back. The patient is being followed for their low back symptoms. They are now 4 week(s) out from when symptoms began. Symptoms reported today include: pain (better), numbness (left calf) and foot pain ("electrical shocks" in the left ankle). Current treatment includes: activity modification, NSAIDs and pain medications. The following medication has been used for pain control: antiinflammatory medication (ibuprofen as needed) and Tylenol (as needed).  Chaniqua Platts follows up. Continued pain and weakness. She feels like her pain is better, as she has not been doing any type of activities. She tried to ambulate over the weekend and had some severe pain down into her ankle.  Past Medical History  Diagnosis Date  . Angiofibroma     CELLULAR, VULVAR ANGIOFIBROMA , BENIGN  . Depression     Past Surgical History  Procedure Laterality Date  . Mass excision  07/2005    VULVA    Family History  Problem Relation Age of Onset  . Hypertension Mother   . Cancer Mother     BLADDER  . Hypertension Father   . Cancer Father     COLON  . Heart disease Father   . Breast cancer Paternal Aunt   . Diabetes Paternal Grandmother   . Breast cancer Paternal Aunt   . Breast cancer Paternal Aunt    Social History:  reports that she quit smoking about 12 years ago. She has never used smokeless tobacco. She reports that she drinks alcohol. She reports that she does not use illicit drugs.  Allergies:  Allergies  Allergen Reactions  . Sulfa Antibiotics Rash     (Not in a hospital admission)  No results found for this or any previous visit (from the past 48 hour(s)). No results found.  Review of Systems  Constitutional: Negative.   HENT: Negative.   Eyes: Negative.   Respiratory: Negative.   Cardiovascular: Negative.    Gastrointestinal: Negative.   Genitourinary: Negative.   Musculoskeletal: Positive for back pain.  Skin: Negative.   Neurological: Positive for sensory change and focal weakness.  Psychiatric/Behavioral: Negative.     There were no vitals taken for this visit. Physical Exam  Constitutional: She is oriented to person, place, and time. She appears well-developed and well-nourished.  HENT:  Head: Normocephalic and atraumatic.  Eyes: Conjunctivae and EOM are normal. Pupils are equal, round, and reactive to light.  Neck: Normal range of motion. Neck supple.  Cardiovascular: Normal rate and regular rhythm.   Respiratory: Effort normal and breath sounds normal.  GI: Soft. Bowel sounds are normal.  Musculoskeletal:  On exam, she's upright, in mild distress. Mood and affect appropriate. She walks with an antalgic gait. SLR produces buttock, thigh and calf pain on the left, negative on the right. She has quad weakness at 5-/5 as she does EHL weakness, 4+/5, as well as tibialis anterior weakness. Altered sensation in the L4 dermatome. Pain with extension.  Lumbar spine exam reveals no evidence of soft tissue swelling, ecchymosis or deformity. The abdomen is soft and nontender. Nontender over the trochanters. No cellulitis or lymphadenopathy.  Good range of motion of the lumbar spine without associated pain. Motor is 5/5 including plantar flexion and hamstrings. Patient is normoreflexic. There is no Babinski or clonus. Sensory exam is intact to light touch. The patient has good distal pulses. No  DVT. No pain and normal range of motion without instability of the hips, knees and ankles.  Neurological: She is alert and oriented to person, place, and time. She has normal reflexes.  Skin: Skin is warm and dry.  Psychiatric: She has a normal mood and affect.    MRI demonstrates an extruded fragment, fairly large, at L4-5 extending cephalad, compressing into the 4 foramen, compressing the 4 and  5 roots.  Three view radiographs demonstrate disc degeneration at 4-5 and 5-1. No instability in flexion and extension.  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  We discussed options. We discussed increasing her level of activity and ambulation to determine whether she has persistent radicular symptoms associated with activity. I do feel that with ongoing neurologic deficit in the presence of a neural compressive lesion, it would be wise to consider decompression.  Risks and benefits of this procedure were discussed with the patient including worsening of symptoms, no changes in symptoms, recurrent disc herniation, scar tissue, epidural fibrosis, damage to neurovascular structures, cerebral spinal fluid leak which would require repair or patching, DVT, PE, anesthetic complications, etc. We discussed the perioperative course, the hospitalization, and the need for postoperative rehabilitation and the time estimate for recovery. We also discussed the possibility of future surgery including repeat decompression, fusion. The patient was provided an illustrated handout which was discussed in detail. Appropriate anatomic models were used as well.  Ms. Fuhs asked concerning whether she could have the surgery done in [place]. 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 With the risks associated with spinal decompression, I would not recommend that type of treatment for this type of injury, especially in the presence of neurologic deficit. We will proceed with lumbar decompression scheduling. They will increase her level of activity over the weekend at an [employer] show. If that continues to aggravate her symptoms in terms of weakness and pain, we will continue with our plan to  proceed with an outpatient decompression. If she has resolution of her symptoms including her weakness, then I certainly indicated this could then be converted to observation. This is now near 4 weeks in duration, and she's had home stretches and exercises, Prednisone and Norco. We discussed continued HEP, activity modification with her and home stretching and an attempt at a McKenzie type technique. If she improves dramatically, then we can consider conservative observation. In the presence of a neural compressive lesion, with a neurologic deficit, I recommend decompression.  Plan microlumbar decompression L4-5 left, possible L3-4  BISSELL, JACLYN M. PA-C for Dr. Tonita Cong 07/27/2014, 12:58 PM

## 2014-08-15 NOTE — Transfer of Care (Signed)
Immediate Anesthesia Transfer of Care Note  Patient: Gail Jackson  Procedure(s) Performed: Procedure(s) (LRB): MICRO LUMBAR DECOMPRESSION L4 - L5 LEFT  (Left)  Patient Location: PACU  Anesthesia Type: General  Level of Consciousness: sedated, patient cooperative and responds to stimulation  Airway & Oxygen Therapy: Patient Spontanous Breathing and Patient connected to face mask oxgen  Post-op Assessment: Report given to PACU RN and Post -op Vital signs reviewed and stable  Post vital signs: Reviewed and stable  Complications: No apparent anesthesia complications

## 2014-08-16 ENCOUNTER — Encounter (HOSPITAL_COMMUNITY): Payer: Self-pay | Admitting: Specialist

## 2014-08-16 DIAGNOSIS — M5126 Other intervertebral disc displacement, lumbar region: Secondary | ICD-10-CM | POA: Diagnosis not present

## 2014-08-16 NOTE — Evaluation (Signed)
Physical Therapy Evaluation Patient Details Name: Gail Jackson MRN: [id number] DOB: [birth year] Today's Date: 08/16/2014   History of Present Illness  44 yo female s/p MICRO LUMBAR DECOMPRESSION L4 - L5 LEFT    Clinical Impression  Pt doing well, will have husband assist at home prn; will need RW for home    Follow Up Recommendations No PT follow up    Equipment Recommendations  Rolling walker with 5" wheels    Recommendations for Other Services       Precautions / Restrictions Precautions Precautions: Back Precaution Comments: handout issued Restrictions Weight Bearing Restrictions: No      Mobility  Bed Mobility Overal bed mobility: Needs Assistance Bed Mobility: Rolling;Sidelying to Sit Rolling: Supervision Sidelying to sit: Min guard       General bed mobility comments: cues for technique  Transfers Overall transfer level: Needs assistance Equipment used: Rolling walker (2 wheeled) Transfers: Sit to/from Stand           General transfer comment: x3; cues for hand placement and back position/posture  Ambulation/Gait   Ambulation Distance (Feet): 200 Feet (30' more) Assistive device: Rolling walker (2 wheeled) Gait Pattern/deviations: Step-through pattern;Decreased stride length     General Gait Details: cues for back precautions with turns and RW safety  Stairs Stairs: Yes Stairs assistance: Supervision Stair Management: No rails;Alternating pattern;Forwards;One rail Right Number of Stairs: 5 (x2) General stair comments: cues for technique  Wheelchair Mobility    Modified Rankin (Stroke Patients Only)       Balance                                             Pertinent Vitals/Pain Pain Assessment: 0-10 Pain Score: 4  Pain Location: back Pain Descriptors / Indicators: Sore Pain Intervention(s): Monitored during session;Premedicated before session;Repositioned;Limited activity within patient's tolerance    Home  Living Family/patient expects to be discharged to:: Private residence Living Arrangements: Spouse/significant other Available Help at Discharge: Family Type of Home: House Home Access: Stairs to enter Entrance Stairs-Rails: None Technical brewer of Steps: 2 Home Layout: One level Home Equipment: None      Prior Function Level of Independence: Independent         Comments: husband will be there 24/7 until Monday     Hand Dominance        Extremity/Trunk Assessment   Upper Extremity Assessment: Defer to OT evaluation           Lower Extremity Assessment: Overall WFL for tasks assessed         Communication   Communication: No difficulties  Cognition Arousal/Alertness: Awake/alert Behavior During Therapy: WFL for tasks assessed/performed Overall Cognitive Status: Within Functional Limits for tasks assessed                      General Comments      Exercises        Assessment/Plan    PT Assessment Patent does not need any further PT services  PT Diagnosis Difficulty walking   PT Problem List    PT Treatment Interventions     PT Goals (Current goals can be found in the Care Plan section) Acute Rehab PT Goals PT Goal Formulation: All assessment and education complete, DC therapy    Frequency     Barriers to discharge        Co-evaluation  End of Session   Activity Tolerance: Patient tolerated treatment well Patient left: in chair;with call bell/phone within reach;with family/visitor present Nurse Communication: Mobility status    Functional Assessment Tool Used: clinical judgement Functional Limitation: Mobility: Walking and moving around Mobility: Walking and Moving Around Current Status (G2836): At least 1 percent but less than 20 percent impaired, limited or restricted Mobility: Walking and Moving Around Goal Status [phone]): At least 1 percent but less than 20 percent impaired, limited or  restricted Mobility: Walking and Moving Around Discharge Status [phone]): At least 1 percent but less than 20 percent impaired, limited or restricted    Time: 0906-0947 PT Time Calculation (min) (ACUTE ONLY): 41 min   Charges:   PT Evaluation $Initial PT Evaluation Tier I: 1 Procedure PT Treatments $Gait Training: 23-37 mins   PT G Codes:   PT G-Codes **NOT FOR INPATIENT CLASS** Functional Assessment Tool Used: clinical judgement Functional Limitation: Mobility: Walking and moving around Mobility: Walking and Moving Around Current Status (K3546): At least 1 percent but less than 20 percent impaired, limited or restricted Mobility: Walking and Moving Around Goal Status [phone]): At least 1 percent but less than 20 percent impaired, limited or restricted Mobility: Walking and Moving Around Discharge Status [phone]): At least 1 percent but less than 20 percent impaired, limited or restricted    [facility] 08/16/2014, 10:27 AM

## 2014-08-16 NOTE — Progress Notes (Signed)
Instructions given to pt. Discharged from floor via w/c, belongings and spouse with pt. No changes in assessment. Jayna Mulnix, [employer]

## 2014-08-16 NOTE — Discharge Summary (Signed)
Physician Discharge Summary   Patient ID: Gail Jackson MRN: [id number] DOB/AGE: 01/30/1971 44 y.o.  Admit date: 08/15/2014 Discharge date: 08/16/2014  Primary Diagnosis:   HNP L4 - L5 Left  Admission Diagnoses:  Past Medical History  Diagnosis Date  . Angiofibroma     CELLULAR, VULVAR ANGIOFIBROMA , BENIGN  . Depression   . Anxiety   . Headache   . Anemia    Discharge Diagnoses:   Principal Problem:   HNP (herniated nucleus pulposus), lumbar Active Problems:   Spinal stenosis at L4-L5 level  Procedure:  Procedure(s) (LRB): MICRO LUMBAR DECOMPRESSION L4 - L5 LEFT  (Left)   Consults: None  HPI:  see H&P    Laboratory Data: No results found for any previous visit. No results for input(s): HGB in the last 72 hours. No results for input(s): WBC, RBC, HCT, PLT in the last 72 hours. No results for input(s): NA, K, CL, CO2, BUN, CREATININE, GLUCOSE, CALCIUM in the last 72 hours. No results for input(s): LABPT, INR in the last 72 hours.  X-Rays:Dg Spine Portable 1 View  08/15/2014   CLINICAL DATA:  44 year old female undergoing lumbar spine surgery. Initial encounter.  EXAM: PORTABLE SPINE - 1 VIEW  COMPARISON:  Intraoperative images labeled #1 and #2 from the same day reported separately.  FINDINGS: Same numbering system as on the earlier intraoperative images from today, designating normal lumbar segmentation which appears to be valid based on the lowest ribs.  Intraoperative portable cross-table lateral view of the lumbar spine labeled image #3. Surgical probe is been advanced to the L4-L5 disc space.  IMPRESSION: Intraoperative localization at L4-L5.   Electronically Signed   By: Lars Pinks M.D.   On: 08/15/2014 10:12   Dg Spine Portable 1 View  08/15/2014   CLINICAL DATA:  Lumbar decompression L4-5.  EXAM: PORTABLE SPINE - 1 VIEW  COMPARISON:  None.  FINDINGS: Posterior surgical instruments are in place overlying the L4 spinous process and the L4-5 interspace.  Linear  metallic foreign body projects over the lower sacrum/coccyx posteriorly of unknown etiology.  IMPRESSION: Intraoperative localization as above.  Linear radiopaque foreign body projects over the lower sacrum/ coccyx posteriorly.   Electronically Signed   By: Rolm Baptise M.D.   On: 08/15/2014 09:17   Dg Spine Portable 1 View  08/15/2014   CLINICAL DATA:  Lumbar decompression  EXAM: PORTABLE SPINE - 1 VIEW  COMPARISON:  NONE AVAILABLE  FINDINGS: The lowest interspace is assigned L5-S1 in the ABSENCE OF ANY PRIOR IMAGING DOCUMENTING A NUMBERING SCHEME. This single lateral intraoperative image documents needles projecting in the soft tissues posterior to the L3 and L4 spinous processes. Normal alignment. Patchy aortic calcifications.  IMPRESSION: 1. Intraoperative localization as above   Electronically Signed   By: Arne Cleveland M.D.   On: 08/15/2014 09:02    EKG:No orders found for this or any previous visit.   Hospital Course: Patient was admitted to [facility] and taken to the OR and underwent the above state procedure without complications.  Patient tolerated the procedure well and was later transferred to the recovery room and then to the orthopaedic floor for postoperative care.  They were given PO and IV analgesics for pain control following their surgery.  They were given 24 hours of postoperative antibiotics.   PT was consulted postop to assist with mobility and transfers.  The patient was allowed to be WBAT with therapy and was taught back precautions. Discharge planning was consulted to help  with postop disposition and equipment needs.  Patient had a good night on the evening of surgery and started to get up OOB with therapy on day one. Patient was seen in rounds and was ready to go home on day one.  They were given discharge instructions and dressing directions.  They were instructed on when to follow up in the office with Dr. Tonita Cong.   Diet: Regular diet Activity:WBAT; Lspine  precautions Follow-up:in 14 days Disposition - Home Discharged Condition: good   Discharge Instructions    Call MD / Call 911    Complete by:  As directed   If you experience chest pain or shortness of breath, CALL 911 and be transported to the hospital emergency room.  If you develope a fever above 101 F, pus (white drainage) or increased drainage or redness at the wound, or calf pain, call your surgeon's office.     Constipation Prevention    Complete by:  As directed   Drink plenty of fluids.  Prune juice may be helpful.  You may use a stool softener, such as Colace (over the counter) 100 mg twice a day.  Use MiraLax (over the counter) for constipation as needed.     Diet - low sodium heart healthy    Complete by:  As directed      Increase activity slowly as tolerated    Complete by:  As directed             Medication List    STOP taking these medications        HYDROcodone-acetaminophen 10-325 MG per tablet  Commonly known as:  NORCO      TAKE these medications        acetaminophen 500 MG tablet  Commonly known as:  TYLENOL  Take 1,000 mg by mouth every 6 (six) hours as needed for mild pain or moderate pain.     cholecalciferol 1000 UNITS tablet  Commonly known as:  VITAMIN D  Take 5,000 Units by mouth daily.     docusate sodium 100 MG capsule  Commonly known as:  COLACE  Take 1 capsule (100 mg total) by mouth 2 (two) times daily as needed for mild constipation.     DULoxetine 60 MG capsule  Commonly known as:  CYMBALTA  Take 60 mg by mouth every evening.     ibuprofen 200 MG tablet  Commonly known as:  ADVIL,MOTRIN  Take 400-800 mg by mouth every 6 (six) hours as needed for mild pain or moderate pain.     methocarbamol 500 MG tablet  Commonly known as:  ROBAXIN  Take 1 tablet (500 mg total) by mouth 3 (three) times daily.     multivitamin with minerals Tabs tablet  Take 1 tablet by mouth daily.     oxyCODONE-acetaminophen 5-325 MG per tablet  Commonly  known as:  PERCOCET  Take 1 tablet by mouth every 4 (four) hours as needed.           Follow-up Information    Follow up with BEANE,JEFFREY C, MD In 2 days.   Specialty:  Orthopedic Surgery   Contact information:   [address] [place] [address] [phone]       Signed: Lacie Draft, PA-C Orthopaedic Surgery 08/16/2014, 9:18 AM

## 2014-08-16 NOTE — Progress Notes (Signed)
Subjective: 1 Day Post-Op Procedure(s) (LRB): MICRO LUMBAR DECOMPRESSION L4 - L5 LEFT  (Left) Patient reports pain as moderate.  Reports incisional pain, well controlled. Leg is already feeling better. Voiding without difficulty. Feels ready to go home today. Seen by Dr. Tonita Cong and myself.  Objective: Vital signs in last 24 hours: Temp:  [97.8 F (36.6 C)-99.1 F (37.3 C)] 97.9 F (36.6 C) (02/04 0538) Pulse Rate:  [83-110] 88 (02/04 0538) Resp:  [11-16] 16 (02/04 0538) BP: (96-136)/(51-77) 109/62 mmHg (02/04 0538) SpO2:  [94 %-100 %] 100 % (02/04 0538) FiO2 (%):  [2 %] 2 % (02/03 1747) Weight:  [90.266 kg (199 lb)] 90.266 kg (199 lb) (02/03 1200)  Intake/Output from previous day: 02/03 0701 - 02/04 0700 In: 5311.8 [P.O.:1140; I.V.:2671.8; IV Piggyback:200] Out: 2700 [Urine:2650; Blood:50] Intake/Output this shift:    No results for input(s): HGB in the last 72 hours. No results for input(s): WBC, RBC, HCT, PLT in the last 72 hours. No results for input(s): NA, K, CL, CO2, BUN, CREATININE, GLUCOSE, CALCIUM in the last 72 hours. No results for input(s): LABPT, INR in the last 72 hours.  Neurologically intact ABD soft Neurovascular intact Sensation intact distally Intact pulses distally Dorsiflexion/Plantar flexion intact Incision: dressing C/D/I and no drainage No cellulitis present Compartment soft no sign of DVT  Assessment/Plan: 1 Day Post-Op Procedure(s) (LRB): MICRO LUMBAR DECOMPRESSION L4 - L5 LEFT  (Left) Advance diet Up with therapy D/C IV fluids  Reviewed D/C instructions, Lspine precautions, dressing instructions Up with PT, ambulation Plan D/c home today after PT Follow up in 2 weeks for suture removal   Gail Jackson M. 08/16/2014, 9:15 AM

## 2014-08-16 NOTE — Care Management Note (Signed)
    Page 1 of 1   08/16/2014     12:11:15 PM CARE MANAGEMENT NOTE 08/16/2014  Patient:  Gail Jackson, Gail Jackson   Account Number:  [id number]  Date Initiated:  08/16/2014  Documentation initiated by:  [facility]  Subjective/Objective Assessment:   adm:   Angiofibroma         CELLULAR, VULVAR ANGIOFIBROMA , BENIGN      Action/Plan:   discharge planning   Anticipated DC Date:  08/16/2014   Anticipated DC Plan:  [place]  CM consult      Choice offered to / List presented to:     DME arranged  Vassie Moselle      DME agency  [place].        Status of service:  Completed, signed off Medicare Important Message given?   (If response is "NO", the following Medicare IM given date fields will be blank) Date Medicare IM given:   Medicare IM given by:   Date Additional Medicare IM given:   Additional Medicare IM given by:    Discharge Disposition:  HOME/SELF CARE  Per UR Regulation:    If discussed at Long Length of Stay Meetings, dates discussed:    Comments:  08/16/14 12:00 Cm called [facility] DME rep, Lecretia to please deliver rolling walker to room prior to discharge.  No other CM neds were communicated.  Mariane Masters, BSN, CM [phone].

## 2014-08-16 NOTE — Evaluation (Signed)
Occupational Therapy Evaluation and Discharge Summary Patient Details Name: HALLELUJAH WYSONG MRN: [id number] DOB: Nov 16, 1970 Today's Date: 08/16/2014    History of Present Illness 44 yo female s/p MICRO LUMBAR DECOMPRESSION L4 - L5 LEFT     Clinical Impression   Pt admitted for above diagnosis and overall is doing very well with adls.  All adl education in regards to back precautions has been completed. Pt understands all adl techniques. No further OT necessary.    Follow Up Recommendations  No OT follow up;Supervision - Intermittent    Equipment Recommendations  None recommended by OT    Recommendations for Other Services       Precautions / Restrictions Precautions Precautions: Back Precaution Comments: handout issued Restrictions Weight Bearing Restrictions: No      Mobility Bed Mobility Overal bed mobility: Needs Assistance Bed Mobility: Rolling;Sidelying to Sit Rolling: Supervision Sidelying to sit: Min guard       General bed mobility comments: cues for technique  Transfers Overall transfer level: Needs assistance Equipment used: Rolling walker (2 wheeled) Transfers: Sit to/from [facility] Sit to Stand: Supervision Stand pivot transfers: Supervision       General transfer comment: Safe with hand placement during transfers.    Balance Overall balance assessment: No apparent balance deficits (not formally assessed)                                          ADL Overall ADL's : Needs assistance/impaired Eating/Feeding: Independent;Sitting   Grooming: Modified independent;Standing   Upper Body Bathing: Set up;Sitting   Lower Body Bathing: Supervison/ safety;Sit to/from stand   Upper Body Dressing : Set up;Sitting   Lower Body Dressing: Supervision/safety;Sit to/from stand;Cueing for back precautions   Toilet Transfer: Supervision/safety;Comfort height toilet   Toileting- Clothing Manipulation and Hygiene: Minimal  assistance;Sit to/from stand Toileting - Clothing Manipulation Details (indicate cue type and reason): needs toilet aid.  husband to purchase. Tub/ Shower Transfer: Supervision/safety;Tub transfer;Adhering to back precautions Tub/Shower Transfer Details (indicate cue type and reason): Pt simulated stepping over tub w/o assist. Functional mobility during ADLs: Supervision/safety;Rolling walker General ADL Comments: cuing to keep walker close to her when standing so available when done grooming.  Reveiwed back precautions in relation to all adls.     Vision                     Perception     Praxis      Pertinent Vitals/Pain Pain Assessment: 0-10 Pain Score: 3  Pain Location: back Pain Descriptors / Indicators: Sore Pain Intervention(s): Limited activity within patient's tolerance;Repositioned     Hand Dominance Right   Extremity/Trunk Assessment Upper Extremity Assessment Upper Extremity Assessment: Overall WFL for tasks assessed   Lower Extremity Assessment Lower Extremity Assessment: Defer to PT evaluation   Cervical / Trunk Assessment Cervical / Trunk Assessment: Normal   Communication Communication Communication: No difficulties   Cognition Arousal/Alertness: Awake/alert Behavior During Therapy: WFL for tasks assessed/performed Overall Cognitive Status: Within Functional Limits for tasks assessed                     General Comments       Exercises       Shoulder Instructions      Home Living Family/patient expects to be discharged to:: Private residence Living Arrangements: Spouse/significant other Available Help at Discharge: Family Type of  Home: House Home Access: Stairs to enter [employer] of Steps: 2 Entrance Stairs-Rails: None Home Layout: One level     Bathroom Shower/Tub: Tub/shower unit;Curtain Shower/tub characteristics: Architectural technologist: Standard     Home Equipment: None          Prior  Functioning/Environment Level of Independence: Independent        Comments: husband will be there 24/7 until Monday    OT Diagnosis:     OT Problem List:     OT Treatment/Interventions:      OT Goals(Current goals can be found in the care plan section) Acute Rehab OT Goals Patient Stated Goal: to go home OT Goal Formulation: All assessment and education complete, DC therapy  OT Frequency:     Barriers to D/C:            Co-evaluation              End of Session Equipment Utilized During Treatment: Rolling walker Nurse Communication: Mobility status  Activity Tolerance: Patient tolerated treatment well;Patient limited by pain Patient left: in chair;with call bell/phone within reach;with family/visitor present   Time: 1035-1104 OT Time Calculation (min): 29 min Charges:  OT General Charges $OT Visit: 1 Procedure OT Evaluation $Initial OT Evaluation Tier I: 1 Procedure OT Treatments $Self Care/Home Management : 8-22 mins G-Codes: OT G-codes **NOT FOR INPATIENT CLASS** Functional Assessment Tool Used: clinical judgment Functional Limitation: Self care Self Care Current Status (Z6109): At least 1 percent but less than 20 percent impaired, limited or restricted Self Care Goal Status (U0454): At least 1 percent but less than 20 percent impaired, limited or restricted Self Care Discharge Status [id number]): At least 1 percent but less than 20 percent impaired, limited or restricted  Glenford Peers 08/16/2014, 11:15 AM  [phone]

## 2015-04-17 ENCOUNTER — Other Ambulatory Visit (HOSPITAL_BASED_OUTPATIENT_CLINIC_OR_DEPARTMENT_OTHER): Payer: Self-pay | Admitting: Family Medicine

## 2015-04-17 DIAGNOSIS — Z1231 Encounter for screening mammogram for malignant neoplasm of breast: Secondary | ICD-10-CM

## 2015-04-29 ENCOUNTER — Ambulatory Visit (HOSPITAL_BASED_OUTPATIENT_CLINIC_OR_DEPARTMENT_OTHER)
Admission: RE | Admit: 2015-04-29 | Discharge: 2015-04-29 | Disposition: A | Discharge status: Home / Self Care | Payer: Commercial Managed Care - PPO | Source: Ambulatory Visit | Attending: Family Medicine | Admitting: Family Medicine

## 2015-04-29 DIAGNOSIS — Z1231 Encounter for screening mammogram for malignant neoplasm of breast: Secondary | ICD-10-CM | POA: Diagnosis present

## 2015-04-29 DIAGNOSIS — R928 Other abnormal and inconclusive findings on diagnostic imaging of breast: Secondary | ICD-10-CM | POA: Insufficient documentation

## 2015-05-14 ENCOUNTER — Other Ambulatory Visit: Payer: Self-pay | Admitting: Family Medicine

## 2015-05-14 DIAGNOSIS — R928 Other abnormal and inconclusive findings on diagnostic imaging of breast: Secondary | ICD-10-CM

## 2015-05-20 ENCOUNTER — Ambulatory Visit
Admission: RE | Admit: 2015-05-20 | Discharge: 2015-05-20 | Disposition: A | Discharge status: Home / Self Care | Payer: Commercial Managed Care - PPO | Source: Ambulatory Visit | Attending: Family Medicine | Admitting: Family Medicine

## 2015-05-20 DIAGNOSIS — R928 Other abnormal and inconclusive findings on diagnostic imaging of breast: Secondary | ICD-10-CM

## 2015-12-18 IMAGING — MG MM DIAG BREAST TOMO UNI LEFT
4 series · 4 of 12 positions shown · non-contrast
Comparison: 04/29/2015 and prior mammograms dating back to
04/10/2013

CLINICAL DATA: 44-year-old female with possible left breast
asymmetry on screening mammogram.

EXAM:
DIGITAL DIAGNOSTIC LEFT MAMMOGRAM WITH 3D TOMOSYNTHESIS AND CAD

[L MLO]
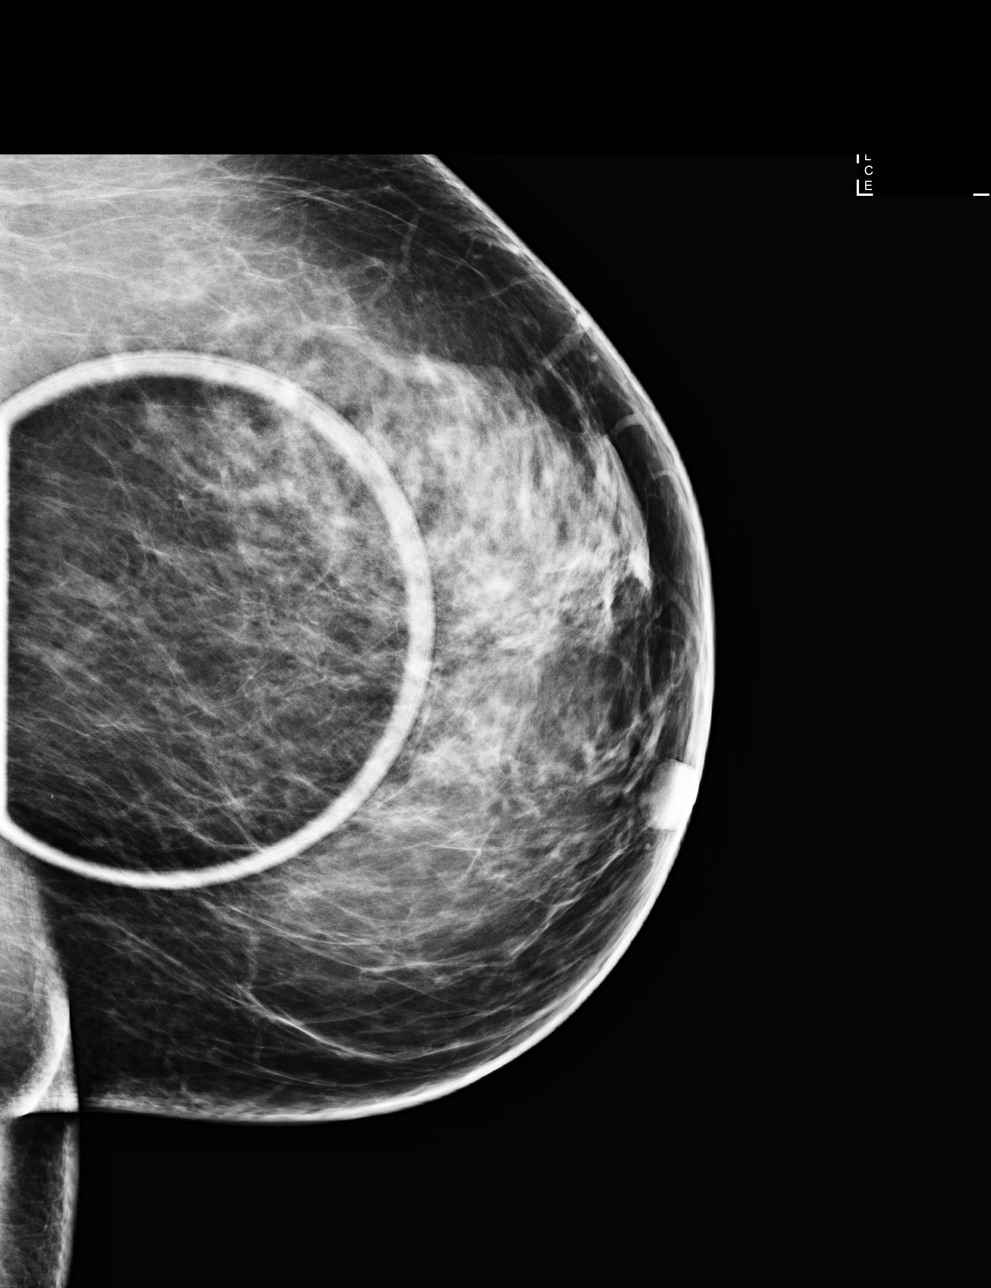

[L ML]
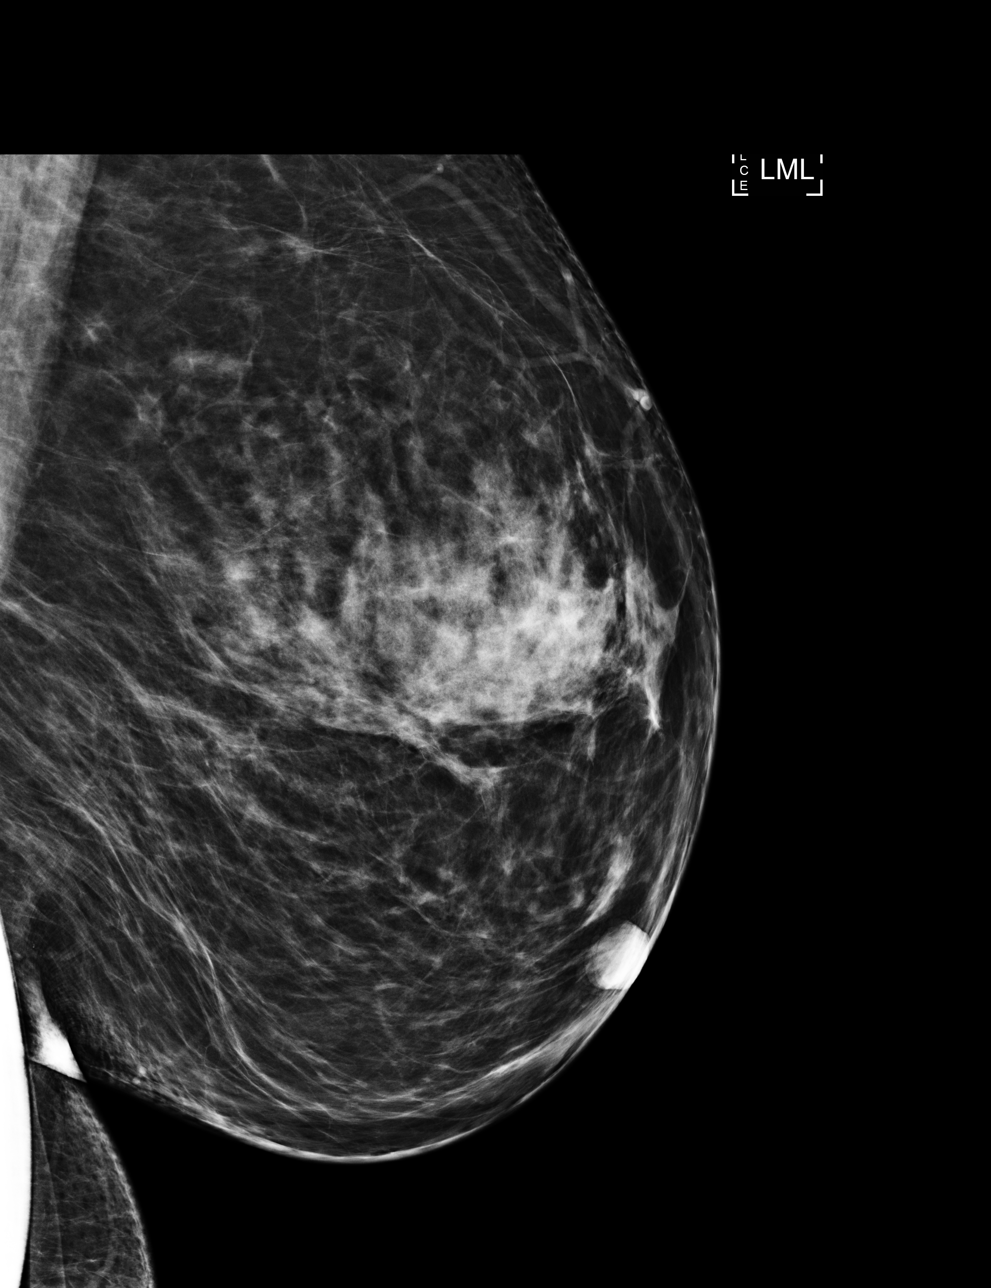

[L MLO tomo · tomo slice 41/81.0]
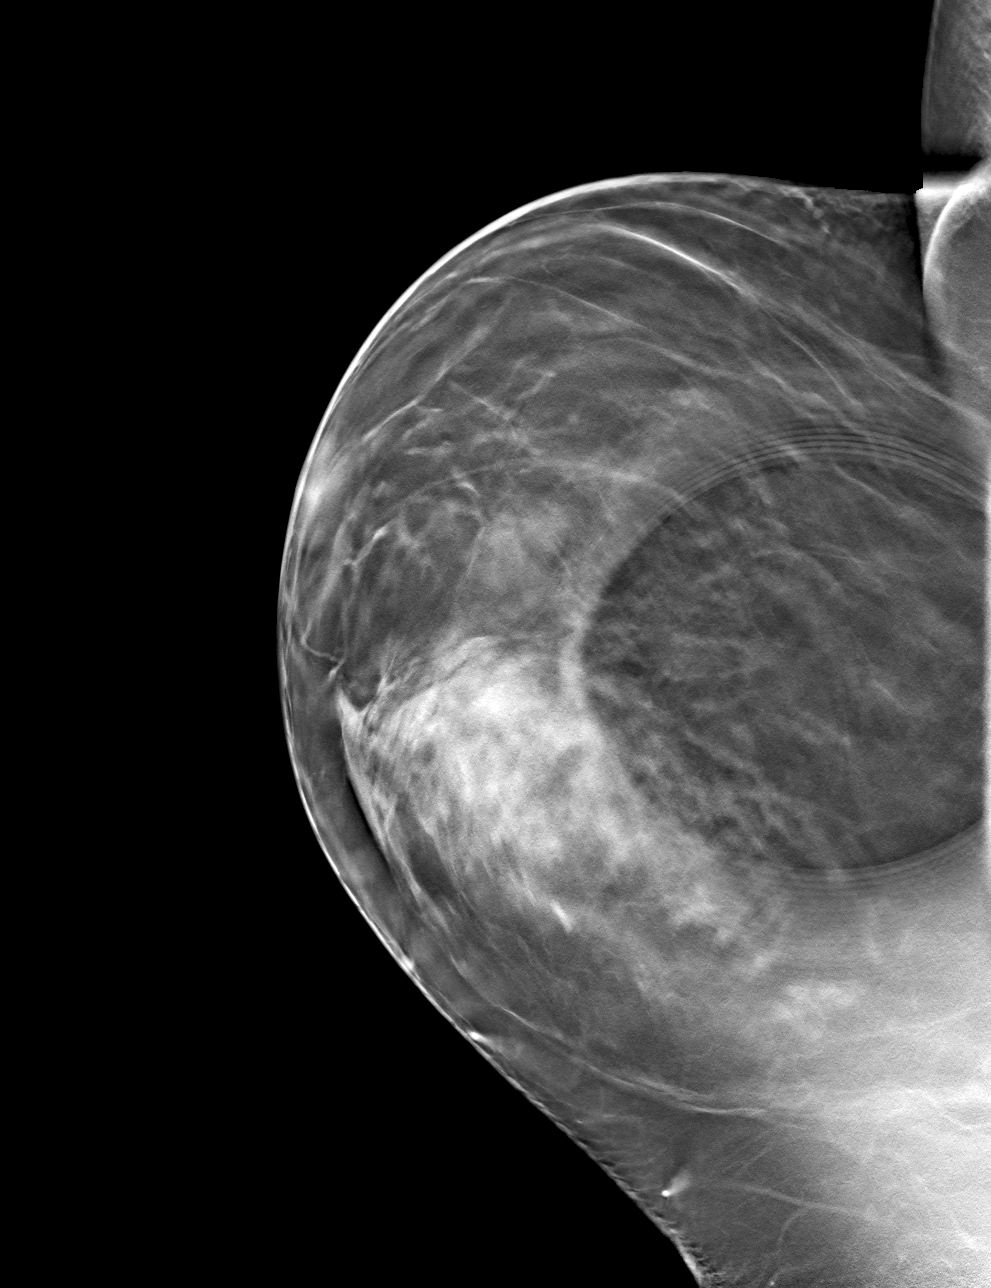

[L ML tomo · tomo slice 43/85.0]
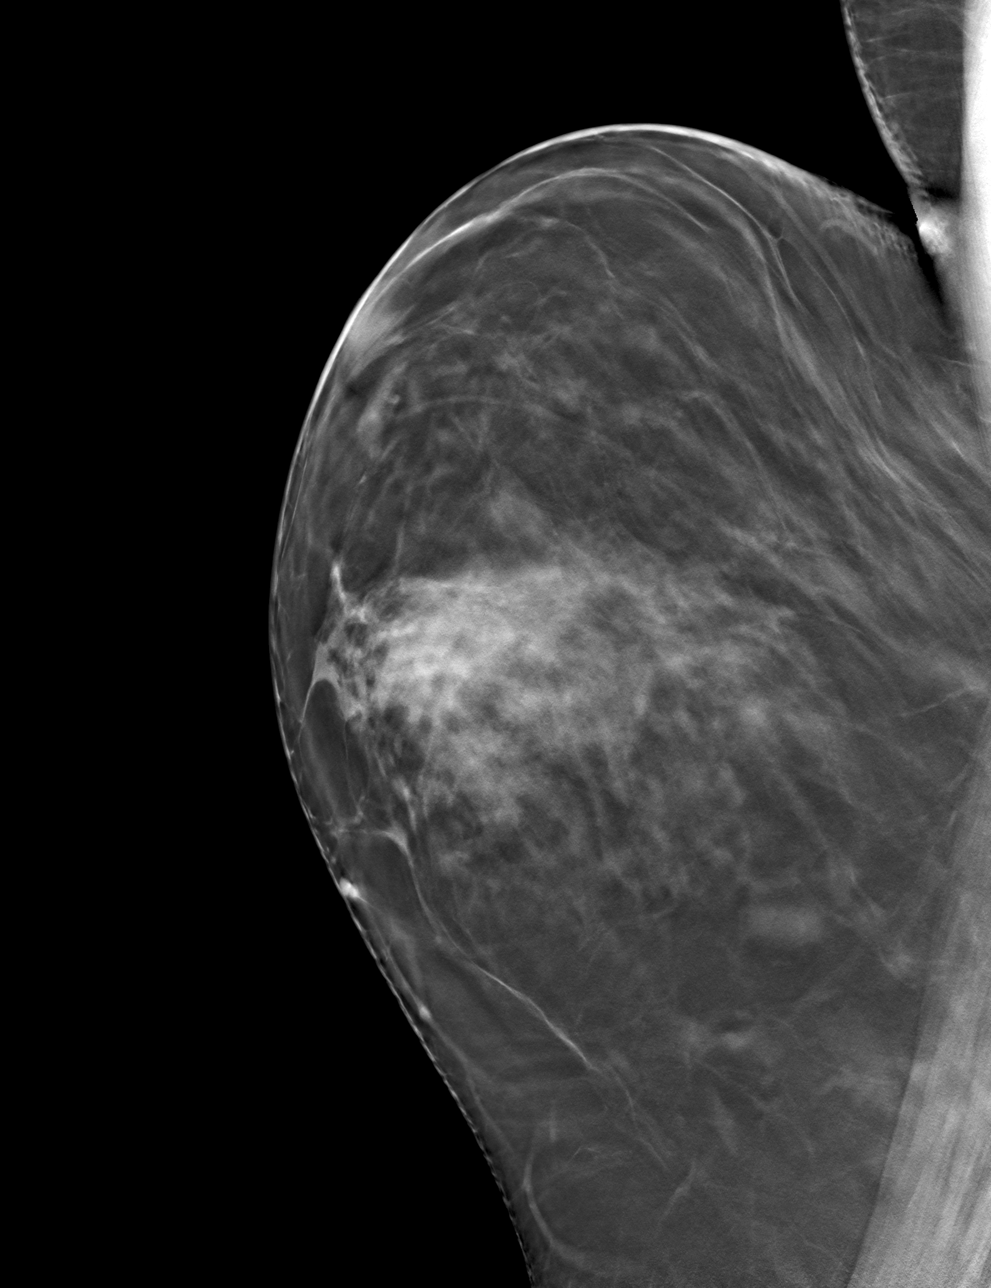

[4 of 12 positions shown; findings below may reference images not displayed]

ACR Breast Density Category c: The breast tissue is heterogeneously
dense, which may obscure small masses.
FINDINGS: 2D and 3D spot compression and full field ML views of the left
breast demonstrate no suspicious mass, distortion or worrisome
calcifications. No persistent abnormality is identified in the area
of the screening study finding.

Mammographic images were processed with CAD.
IMPRESSION: No persistent abnormality in the area of the screening study
finding.

RECOMMENDATION:
Bilateral screening mammograms in 1 year.

I have discussed the findings and recommendations with the patient.
Results were also provided in writing at the conclusion of the
visit. If applicable, a reminder letter will be sent to the patient
regarding the next appointment.

BI-RADS CATEGORY  1: Negative.

## 2016-03-09 ENCOUNTER — Ambulatory Visit: Payer: Self-pay | Admitting: Gynecology

## 2016-05-14 ENCOUNTER — Other Ambulatory Visit (HOSPITAL_BASED_OUTPATIENT_CLINIC_OR_DEPARTMENT_OTHER): Payer: Self-pay | Admitting: Family Medicine

## 2016-05-14 DIAGNOSIS — Z1231 Encounter for screening mammogram for malignant neoplasm of breast: Secondary | ICD-10-CM

## 2016-05-19 ENCOUNTER — Ambulatory Visit (HOSPITAL_BASED_OUTPATIENT_CLINIC_OR_DEPARTMENT_OTHER)
Admission: RE | Admit: 2016-05-19 | Discharge: 2016-05-19 | Disposition: A | Discharge status: Home / Self Care | Payer: BLUE CROSS/BLUE SHIELD | Source: Ambulatory Visit | Attending: Family Medicine | Admitting: Family Medicine

## 2016-05-19 DIAGNOSIS — Z1231 Encounter for screening mammogram for malignant neoplasm of breast: Secondary | ICD-10-CM | POA: Diagnosis present

## 2016-05-21 ENCOUNTER — Encounter: Payer: Self-pay | Admitting: Gynecology

## 2016-05-21 ENCOUNTER — Ambulatory Visit (INDEPENDENT_AMBULATORY_CARE_PROVIDER_SITE_OTHER): Payer: BLUE CROSS/BLUE SHIELD | Admitting: Gynecology

## 2016-05-21 VITALS — BP 122/76 | Ht 65.0 in | Wt 206.0 lb

## 2016-05-21 DIAGNOSIS — Z01419 Encounter for gynecological examination (general) (routine) without abnormal findings: Secondary | ICD-10-CM

## 2016-05-21 DIAGNOSIS — R102 Pelvic and perineal pain: Secondary | ICD-10-CM

## 2016-05-21 NOTE — Addendum Note (Signed)
Addended by: Nelva Nay on: 05/21/2016 09:07 AM   Modules accepted: Orders

## 2016-05-21 NOTE — Patient Instructions (Signed)
Follow up for ultrasound as scheduled 

## 2016-05-21 NOTE — Progress Notes (Signed)
    Gail Jackson March 25, 1971 [id number]        45 y.o.  G1P1  for annual exam.  Has not been in the practice approaching 5 years. Several issues noted below.  Past medical history,surgical history, problem list, medications, allergies, family history and social history were all reviewed and documented as reviewed in the EPIC chart.  ROS:  Performed with pertinent positives and negatives included in the history, assessment and plan.   Additional significant findings :  None   Exam: Caryn Bee assistant Vitals:   05/21/16 0827  BP: 122/76  Weight: 206 lb (93.4 kg)  Height: 5\' 5"  (1.651 m)   Body mass index is 34.28 kg/m.  General appearance:  Normal affect, orientation and appearance. Skin: Grossly normal HEENT: Without gross lesions.  No cervical or supraclavicular adenopathy. Thyroid normal.  Lungs:  Clear without wheezing, rales or rhonchi Cardiac: RR, without RMG Abdominal:  Soft, nontender, without masses, guarding, rebound, organomegaly or hernia Breasts:  Examined lying and sitting without masses, retractions, discharge or axillary adenopathy. Pelvic:  Ext, BUS, Vagina normal.  At tail end of her menses.  Cervix normal. Pap smear/HPV done  Uterus anteverted, normal size, shape and contour, midline and mobile nontender   Adnexa without masses or tenderness    Anus and perineum normal   Rectovaginal normal sphincter tone without palpated masses or tenderness.    Assessment/Plan:  45 y.o. G1P1 female for annual exam. Regular menses, vasectomy birth control  1. Patient does note her periods are getting slightly heavier. They are regular monthly with no intermenstrual bleeding. Discussed possibilities to include physiologic versus pathologic such as leiomyoma or polyps. Will schedule ultrasound per #2 and will also look at the endometrial echo at that point. 2. Right pelvic pain with her menses consistently each month. Also notes mid cycle right pelvic discomfort for one  or 2 days. Has been going on for over a year. Exam is normal.  No GI symptoms such as nausea vomiting diarrhea constipation. No GU symptoms such as frequency dysuria or urgency. Will check baseline ultrasound rule out nonpalpable abnormalities. 3. Pap smear 2011. Pap smear/HPV today. No history of abnormal Pap smears previously. 4. Mammography 05/2016. Continue with annual mammography when due. SBE monthly reviewed. 5. Health maintenance. Patient reports having baseline fasting lab work done yesterday through her primary physician's office. Follow up ultrasound. Follow up for annual exam in one year.  Anastasio Auerbach MD, 8:52 AM 05/21/2016

## 2016-05-22 LAB — URINALYSIS W MICROSCOPIC + REFLEX CULTURE
BILIRUBIN URINE: NEGATIVE
Bacteria, UA: NONE SEEN [HPF]
CRYSTALS: NONE SEEN [HPF]
Casts: NONE SEEN [LPF]
Glucose, UA: NEGATIVE
Hgb urine dipstick: NEGATIVE
Ketones, ur: NEGATIVE
Nitrite: NEGATIVE
PROTEIN: NEGATIVE
RBC / HPF: NONE SEEN RBC/HPF (ref <=2)
Specific Gravity, Urine: 1.012 (ref 1.001–1.035)
Yeast: NONE SEEN [HPF]
pH: 6 (ref 5.0–8.0)

## 2016-05-23 LAB — URINE CULTURE: ORGANISM ID, BACTERIA: NO GROWTH

## 2016-05-26 LAB — PAP IG AND HPV HIGH-RISK: HPV DNA High Risk: NOT DETECTED

## 2016-06-15 ENCOUNTER — Other Ambulatory Visit: Payer: BLUE CROSS/BLUE SHIELD

## 2016-06-15 ENCOUNTER — Ambulatory Visit: Payer: BLUE CROSS/BLUE SHIELD | Admitting: Gynecology

## 2019-04-04 ENCOUNTER — Encounter: Payer: Self-pay | Admitting: Gynecology
